# Patient Record
Sex: Male | Born: 1945 | Race: White | Hispanic: No | Marital: Married | Smoking: Never smoker
Health system: Southern US, Community
[De-identification: ages and names within clinical notes are randomized; demographics above are authoritative.]

## PROBLEM LIST (undated history)

## (undated) DIAGNOSIS — Z8546 Personal history of malignant neoplasm of prostate: Secondary | ICD-10-CM

## (undated) DIAGNOSIS — I10 Essential (primary) hypertension: Secondary | ICD-10-CM

## (undated) DIAGNOSIS — N1831 Chronic kidney disease, stage 3a: Secondary | ICD-10-CM

## (undated) DIAGNOSIS — E119 Type 2 diabetes mellitus without complications: Secondary | ICD-10-CM

## (undated) HISTORY — PX: PROSTATECTOMY: SHX69

## (undated) HISTORY — PX: LITHOTRIPSY: SUR834

---

## 2013-05-08 ENCOUNTER — Encounter (HOSPITAL_BASED_OUTPATIENT_CLINIC_OR_DEPARTMENT_OTHER): Payer: Self-pay | Admitting: Emergency Medicine

## 2013-05-08 ENCOUNTER — Emergency Department (HOSPITAL_BASED_OUTPATIENT_CLINIC_OR_DEPARTMENT_OTHER): Payer: Medicare Other

## 2013-05-08 ENCOUNTER — Emergency Department (HOSPITAL_BASED_OUTPATIENT_CLINIC_OR_DEPARTMENT_OTHER)
Admission: EM | Admit: 2013-05-08 | Discharge: 2013-05-08 | Disposition: A | Discharge status: Home / Self Care | Payer: Medicare Other | Attending: Emergency Medicine | Admitting: Emergency Medicine

## 2013-05-08 DIAGNOSIS — E119 Type 2 diabetes mellitus without complications: Secondary | ICD-10-CM | POA: Insufficient documentation

## 2013-05-08 DIAGNOSIS — Z79899 Other long term (current) drug therapy: Secondary | ICD-10-CM | POA: Insufficient documentation

## 2013-05-08 DIAGNOSIS — M51379 Other intervertebral disc degeneration, lumbosacral region without mention of lumbar back pain or lower extremity pain: Secondary | ICD-10-CM | POA: Insufficient documentation

## 2013-05-08 DIAGNOSIS — M5126 Other intervertebral disc displacement, lumbar region: Secondary | ICD-10-CM

## 2013-05-08 DIAGNOSIS — I10 Essential (primary) hypertension: Secondary | ICD-10-CM | POA: Insufficient documentation

## 2013-05-08 DIAGNOSIS — Z87442 Personal history of urinary calculi: Secondary | ICD-10-CM | POA: Insufficient documentation

## 2013-05-08 DIAGNOSIS — M5137 Other intervertebral disc degeneration, lumbosacral region: Secondary | ICD-10-CM | POA: Insufficient documentation

## 2013-05-08 HISTORY — DX: Essential (primary) hypertension: I10

## 2013-05-08 HISTORY — DX: Type 2 diabetes mellitus without complications: E11.9

## 2013-05-08 MED ORDER — HYDROMORPHONE HCL 2 MG PO TABS: 2.0000 mg | ORAL_TABLET | ORAL | Status: DC | PRN

## 2013-05-08 MED ORDER — MORPHINE SULFATE 4 MG/ML IJ SOLN
4.0000 mg | INTRAMUSCULAR | Status: DC | PRN
  Administered 2013-05-08: 4 mg via INTRAVENOUS
  Filled 2013-05-08: qty 1

## 2013-05-08 MED ORDER — CYCLOBENZAPRINE HCL 10 MG PO TABS
5.0000 mg | ORAL_TABLET | Freq: Once | ORAL | Status: AC
  Administered 2013-05-08: 5 mg via ORAL
  Filled 2013-05-08: qty 1

## 2013-05-08 MED ORDER — CYCLOBENZAPRINE HCL 5 MG PO TABS
7.5000 mg | ORAL_TABLET | Freq: Once | ORAL | Status: DC
  Filled 2013-05-08: qty 1.5

## 2013-05-08 NOTE — Discharge Instructions (Signed)
Back Pain, Adult Low back pain is very common. About 1 in 5 people have back pain.The cause of low back pain is rarely dangerous. The pain often gets better over time.About half of people with a sudden onset of back pain feel better in just 2 weeks. About 8 in 10 people feel better by 6 weeks.  CAUSES Some common causes of back pain include:  Strain of the muscles or ligaments supporting the spine.  Wear and tear (degeneration) of the spinal discs.  Arthritis.  Direct injury to the back. DIAGNOSIS Most of the time, the direct cause of low back pain is not known.However, back pain can be treated effectively even when the exact cause of the pain is unknown.Answering your caregiver's questions about your overall health and symptoms is one of the most accurate ways to make sure the cause of your pain is not dangerous. If your caregiver needs more information, he or she may order lab work or imaging tests (X-rays or MRIs).However, even if imaging tests show changes in your back, this usually does not require surgery. HOME CARE INSTRUCTIONS For many people, back pain returns.Since low back pain is rarely dangerous, it is often a condition that people can learn to manageon their own.   Remain active. It is stressful on the back to sit or stand in one place. Do not sit, drive, or stand in one place for more than 30 minutes at a time. Take short walks on level surfaces as soon as pain allows.Try to increase the length of time you walk each day.  Do not stay in bed.Resting more than 1 or 2 days can delay your recovery.  Do not avoid exercise or work.Your body is made to move.It is not dangerous to be active, even though your back may hurt.Your back will likely heal faster if you return to being active before your pain is gone.  Pay attention to your body when you bend and lift. Many people have less discomfortwhen lifting if they bend their knees, keep the load close to their bodies,and  avoid twisting. Often, the most comfortable positions are those that put less stress on your recovering back.  Find a comfortable position to sleep. Use a firm mattress and lie on your side with your knees slightly bent. If you lie on your back, put a pillow under your knees.  Only take over-the-counter or prescription medicines as directed by your caregiver. Over-the-counter medicines to reduce pain and inflammation are often the most helpful.Your caregiver may prescribe muscle relaxant drugs.These medicines help dull your pain so you can more quickly return to your normal activities and healthy exercise.  Put ice on the injured area.  Put ice in a plastic bag.  Place a towel between your skin and the bag.  Leave the ice on for 15-20 minutes, 03-04 times a day for the first 2 to 3 days. After that, ice and heat may be alternated to reduce pain and spasms.  Ask your caregiver about trying back exercises and gentle massage. This may be of some benefit.  Avoid feeling anxious or stressed.Stress increases muscle tension and can worsen back pain.It is important to recognize when you are anxious or stressed and learn ways to manage it.Exercise is a great option. SEEK MEDICAL CARE IF:  You have pain that is not relieved with rest or medicine.  You have pain that does not improve in 1 week.  You have new symptoms.  You are generally not feeling well. SEEK   IMMEDIATE MEDICAL CARE IF:   You have pain that radiates from your back into your legs.  You develop new bowel or bladder control problems.  You have unusual weakness or numbness in your arms or legs.  You develop nausea or vomiting.  You develop abdominal pain.  You feel faint. Document Released: 02/21/2005 Document Revised: 08/23/2011 Document Reviewed: 07/12/2010 [employer] Patient Information 2014 [employer].  

## 2013-05-08 NOTE — ED Provider Notes (Signed)
I saw and evaluated the patient, reviewed the resident's note and I agree with the findings and plan.   EKG Interpretation None      No results found for this or any previous visit. Ct Lumbar Spine Wo Contrast  05/08/2013   CLINICAL DATA:  Low back pain  EXAM: CT LUMBAR SPINE WITHOUT CONTRAST  TECHNIQUE: Multidetector CT imaging of the lumbar spine was performed without intravenous contrast administration. Multiplanar CT image reconstructions were also generated.  COMPARISON:  CT ABD-PEL WO/W CM dated 10/30/2012  FINDINGS: The vertebral body heights are maintained. The alignment is anatomic. The paravertebral soft tissues are normal. There is no fracture or static listhesis. There is no spondylolysis. There is degenerative disc disease most significant at L3-4, L4-5 and L5-S1.  There are mild degenerative changes of bilateral SI joints.  There is abdominal aortic atherosclerosis. There is a nonobstructing left nephrolithiasis. There is a 3.7 cm exophytic left upper pole renal mass measuring fluid attenuation most consistent with a cyst.  T12-L1: Mild broad-based disc bulge. No foraminal or central canal stenosis.  L1-L2: Minimal broad-based disc bulge. Mild bilateral facet arthropathy. No central canal or foraminal stenosis.  L2-L3: Mild broad-based disc bulge. Mild bilateral facet arthropathy. No foraminal or central canal stenosis.  L3-L4: Mild broad-based disc osteophyte complex with a more focal left far lateral disc osteophyte complex. Mild bilateral facet arthropathy. No right foraminal stenosis. Mild left foraminal stenosis.  L4-L5: Moderate size right foraminal disc protrusion resulting in severe foraminal stenosis. There is no left foraminal stenosis. There is mild bilateral facet arthropathy. Marland Kitchen.  L5-S1: Mild broad-based disc osteophyte complex. Moderate bilateral foraminal stenosis. Mild bilateral facet arthropathy.  IMPRESSION: 1. At L4-5 there is a moderate size right foraminal disc protrusion  resulting in severe right foraminal stenosis. 2. Nonobstructing left nephrolithiasis. 3. Left renal cyst. 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 Please refer to the CT abdomen/ pelvis dated 10/30/2012.   Electronically Signed   By: Elige KoHetal  Patel   On: 05/08/2013 12:43      Patient seen by me. Patient with a history of some back problems for the past 3 weeks ago acutely worse today. Patient followed by chiropractor in the past x-rays taken by them presumably without any sniffing abnormalities. Patient has been told that he does have sciatica. Has not had an MRI. Patient has pain shooting down the right leg. CT scan of the lumbar area rules out any acute bony injury or lytic lesions or tumors. But does show significant disease it could be responsible for his pain. Multiple levels of disc protrusion. Patient does have a primary care Dr. will have them contact him for consideration for MRI. Not available here today. Patient will be started on pain medicine and rest. Patient will return for new or worse symptoms. Patient is improved here with pain medicine he received in the emergency department.  Shelda JakesScott W. Shivan Hodes, MD 05/08/13 [phone]
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
## 2013-05-08 NOTE — ED Notes (Signed)
Xray called to report pt on table but has refused exam until pan med given-Sarah Bush,RN to xray to start IV and to give pain morphine

## 2013-05-08 NOTE — ED Notes (Signed)
Pt amb to room 10 with slow, steady gait. Pt reports back pain x 3 weeks, has been seeing a chiropractor, xrays taken and per pt he was told he has sciatica. Pt states that his pain increased this am, and it shoots down his right leg.

## 2013-06-06 ENCOUNTER — Other Ambulatory Visit: Payer: Self-pay | Admitting: Neurosurgery

## 2013-06-06 ENCOUNTER — Ambulatory Visit
Admission: RE | Admit: 2013-06-06 | Discharge: 2013-06-06 | Disposition: A | Discharge status: Home / Self Care | Payer: Medicare Other | Source: Ambulatory Visit | Attending: Neurosurgery | Admitting: Neurosurgery

## 2013-06-06 DIAGNOSIS — M5416 Radiculopathy, lumbar region: Secondary | ICD-10-CM

## 2013-06-06 MED ORDER — IOHEXOL 180 MG/ML  SOLN
1.0000 mL | Freq: Once | INTRAMUSCULAR | Status: AC | PRN
  Administered 2013-06-06: 1 mL via EPIDURAL

## 2013-06-06 MED ORDER — METHYLPREDNISOLONE ACETATE 40 MG/ML INJ SUSP (RADIOLOG
120.0000 mg | Freq: Once | INTRAMUSCULAR | Status: AC
  Administered 2013-06-06: 120 mg via EPIDURAL

## 2013-06-06 NOTE — Discharge Instructions (Signed)

## 2019-07-12 ENCOUNTER — Emergency Department (HOSPITAL_BASED_OUTPATIENT_CLINIC_OR_DEPARTMENT_OTHER): Payer: Medicare HMO

## 2019-07-12 ENCOUNTER — Emergency Department (HOSPITAL_BASED_OUTPATIENT_CLINIC_OR_DEPARTMENT_OTHER)
Admission: EM | Admit: 2019-07-12 | Discharge: 2019-07-12 | Disposition: A | Discharge status: Home / Self Care | Payer: Medicare HMO | Source: Home / Self Care | Attending: Emergency Medicine | Admitting: Emergency Medicine

## 2019-07-12 ENCOUNTER — Encounter (HOSPITAL_BASED_OUTPATIENT_CLINIC_OR_DEPARTMENT_OTHER): Payer: Self-pay | Admitting: *Deleted

## 2019-07-12 ENCOUNTER — Other Ambulatory Visit: Payer: Self-pay

## 2019-07-12 DIAGNOSIS — R509 Fever, unspecified: Secondary | ICD-10-CM | POA: Insufficient documentation

## 2019-07-12 DIAGNOSIS — Z79899 Other long term (current) drug therapy: Secondary | ICD-10-CM | POA: Insufficient documentation

## 2019-07-12 DIAGNOSIS — R112 Nausea with vomiting, unspecified: Secondary | ICD-10-CM | POA: Insufficient documentation

## 2019-07-12 DIAGNOSIS — U071 COVID-19: Secondary | ICD-10-CM | POA: Insufficient documentation

## 2019-07-12 LAB — RESPIRATORY PANEL BY RT PCR (FLU A&B, COVID)
Influenza A by PCR: NEGATIVE
Influenza B by PCR: NEGATIVE
SARS Coronavirus 2 by RT PCR: POSITIVE — AB

## 2019-07-12 LAB — COMPREHENSIVE METABOLIC PANEL
ALT: 17 U/L (ref 0–44)
AST: 25 U/L (ref 15–41)
Albumin: 4.2 g/dL (ref 3.5–5.0)
Alkaline Phosphatase: 77 U/L (ref 38–126)
Anion gap: 12 (ref 5–15)
BUN: 31 mg/dL — ABNORMAL HIGH (ref 8–23)
CO2: 22 mmol/L (ref 22–32)
Calcium: 9 mg/dL (ref 8.9–10.3)
Chloride: 100 mmol/L (ref 98–111)
Creatinine, Ser: 1.59 mg/dL — ABNORMAL HIGH (ref 0.61–1.24)
GFR calc Af Amer: 49 mL/min — ABNORMAL LOW (ref >60)
GFR calc non Af Amer: 42 mL/min — ABNORMAL LOW (ref >60)
Glucose, Bld: 172 mg/dL — ABNORMAL HIGH (ref 70–99)
Potassium: 4.1 mmol/L (ref 3.5–5.1)
Sodium: 134 mmol/L — ABNORMAL LOW (ref 135–145)
Total Bilirubin: 0.7 mg/dL (ref 0.3–1.2)
Total Protein: 8.2 g/dL — ABNORMAL HIGH (ref 6.5–8.1)

## 2019-07-12 LAB — CBC WITH DIFFERENTIAL/PLATELET
Abs Immature Granulocytes: 0.02 10*3/uL (ref 0.00–0.07)
Basophils Absolute: 0 10*3/uL (ref 0.0–0.1)
Basophils Relative: 0 %
Eosinophils Absolute: 0 10*3/uL (ref 0.0–0.5)
Eosinophils Relative: 0 %
HCT: 41.8 % (ref 39.0–52.0)
Hemoglobin: 13.9 g/dL (ref 13.0–17.0)
Immature Granulocytes: 0 %
Lymphocytes Relative: 15 %
Lymphs Abs: 1 10*3/uL (ref 0.7–4.0)
MCH: 29.6 pg (ref 26.0–34.0)
MCHC: 33.3 g/dL (ref 30.0–36.0)
MCV: 89.1 fL (ref 80.0–100.0)
Monocytes Absolute: 0.4 10*3/uL (ref 0.1–1.0)
Monocytes Relative: 7 %
Neutro Abs: 4.9 10*3/uL (ref 1.7–7.7)
Neutrophils Relative %: 78 %
Platelets: 232 10*3/uL (ref 150–400)
RBC: 4.69 MIL/uL (ref 4.22–5.81)
RDW: 13.3 % (ref 11.5–15.5)
WBC: 6.3 10*3/uL (ref 4.0–10.5)
nRBC: 0 % (ref 0.0–0.2)

## 2019-07-12 LAB — PROTIME-INR
INR: 0.9 (ref 0.8–1.2)
Prothrombin Time: 11.4 seconds (ref 11.4–15.2)

## 2019-07-12 LAB — APTT: aPTT: 30 seconds (ref 24–36)

## 2019-07-12 LAB — CBG MONITORING, ED: Glucose-Capillary: 154 mg/dL — ABNORMAL HIGH (ref 70–99)

## 2019-07-12 LAB — LACTIC ACID, PLASMA: Lactic Acid, Venous: 1.2 mmol/L (ref 0.5–1.9)

## 2019-07-12 LAB — SARS CORONAVIRUS 2 AG (30 MIN TAT): SARS Coronavirus 2 Ag: NEGATIVE

## 2019-07-12 MED ORDER — BENZONATATE 100 MG PO CAPS
100.0000 mg | ORAL_CAPSULE | Freq: Three times a day (TID) | ORAL | 0 refills | Status: AC
Start: 2019-07-12 — End: ?

## 2019-07-12 MED ORDER — SODIUM CHLORIDE 0.9 % IV BOLUS
1000.0000 mL | Freq: Once | INTRAVENOUS | Status: AC
  Administered 2019-07-12: 1000 mL via INTRAVENOUS

## 2019-07-12 MED ORDER — ONDANSETRON 4 MG PO TBDP: ORAL_TABLET | ORAL | 0 refills | Status: AC

## 2019-07-12 MED ORDER — LACTATED RINGERS IV BOLUS (SEPSIS)
1000.0000 mL | Freq: Once | INTRAVENOUS | Status: AC
  Administered 2019-07-12: 19:00:00 1000 mL via INTRAVENOUS

## 2019-07-12 MED ORDER — ACETAMINOPHEN 500 MG PO TABS
1000.0000 mg | ORAL_TABLET | Freq: Once | ORAL | Status: AC
  Administered 2019-07-12: 1000 mg via ORAL
  Filled 2019-07-12: qty 2

## 2019-07-12 NOTE — ED Provider Notes (Signed)
[facility] EMERGENCY DEPARTMENT Provider Note   CSN: [id number] Arrival date & time: 07/12/19  1817     History Chief Complaint  Patient presents with  . URI    Patrick Howard is a 74 y.o. male.  74 yo M with a chief complaints of cough fever. Going on for about a week now. Is also had some episodic nausea and vomiting. Thought to have the coronavirus had a negative initial test. Has been treated as bronchitis. Patient has been getting mildly worse. States that he has started to become confused. Has trouble remembering things. Does not think he is having continued nausea and vomiting. Denies abdominal pain denies chest pain feels that his muscles in his neck are a little bit sore but denies headache.  The history is provided by the patient.  URI Presenting symptoms: cough and fever   Presenting symptoms: no congestion   Associated symptoms: no arthralgias, no headaches and no myalgias   Illness Severity:  Moderate Onset quality:  Gradual Duration:  2 days Timing:  Constant Progression:  Worsening Chronicity:  New Associated symptoms: cough and fever   Associated symptoms: no abdominal pain, no chest pain, no congestion, no diarrhea, no headaches, no myalgias, no rash, no shortness of breath and no vomiting        Past Medical History:  Diagnosis Date  . Diabetes mellitus without complication (HCC)   . Hypertension     There are no problems to display for this patient.   History reviewed. No pertinent surgical history.     History reviewed. No pertinent family history.  Social History   Tobacco Use  . Smoking status: Never Smoker  Substance Use Topics  . Alcohol use: Not on file  . Drug use: Not on file    Home Medications Prior to Admission medications   Medication Sig Start Date End Date Taking? Authorizing Provider  benzonatate (TESSALON) 100 MG capsule Take 1 capsule (100 mg total) by mouth every 8 (eight) hours. 07/12/19   Melene Plan, DO    HYDROmorphone (DILAUDID) 2 MG tablet Take 1-2 tablets (2-4 mg total) by mouth every 4 (four) hours as needed for severe pain. 05/08/13   Kuneff, Renee A, DO  lisinopril (PRINIVIL,ZESTRIL) 10 MG tablet Take 10 mg by mouth daily.    [provider]  metFORMIN (GLUCOPHAGE) 500 MG tablet Take by mouth 2 (two) times daily with a meal.    [provider]  ondansetron (ZOFRAN ODT) 4 MG disintegrating tablet 4mg  ODT q4 hours prn nausea/vomit 07/12/19   09/11/19, DO  repaglinide (PRANDIN) 1 MG tablet Take 1 mg by mouth 2 (two) times daily before a meal.    [provider]    Allergies    Codeine and Novocain [procaine]  Review of Systems   Review of Systems  Constitutional: Positive for fever. Negative for chills.  HENT: Negative for congestion and facial swelling.   Eyes: Negative for discharge and visual disturbance.  Respiratory: Positive for cough. Negative for shortness of breath.   Cardiovascular: Negative for chest pain and palpitations.  Gastrointestinal: Negative for abdominal pain, diarrhea and vomiting.  Musculoskeletal: Negative for arthralgias and myalgias.  Skin: Negative for color change and rash.  Neurological: Negative for tremors, syncope and headaches.  Psychiatric/Behavioral: Negative for confusion and dysphoric mood.    Physical Exam Updated Vital Signs BP 132/76   Pulse 99   Temp (!) 100.4 F (38 C) (Oral)   Resp (!) 22  Ht 5\' 10"  (1.778 m)   Wt 74.8 kg   SpO2 93%   BMI 23.68 kg/m   Physical Exam Vitals and nursing note reviewed.  Constitutional:      Appearance: He is well-developed.  HENT:     Head: Normocephalic and atraumatic.  Eyes:     Pupils: Pupils are equal, round, and reactive to light.  Neck:     Vascular: No JVD.  Cardiovascular:     Rate and Rhythm: Normal rate and regular rhythm.     Heart sounds: No murmur. No friction rub. No gallop.   Pulmonary:     Effort: No respiratory distress.     Breath sounds: No  wheezing.  Abdominal:     General: There is no distension.     Tenderness: There is no abdominal tenderness. There is no guarding or rebound.  Musculoskeletal:        General: Normal range of motion.     Cervical back: Normal range of motion and neck supple.  Skin:    Coloration: Skin is not pale.     Findings: No rash.  Neurological:     Mental Status: He is alert and oriented to person, place, and time.     Comments: Has trouble remembering some events  Psychiatric:        Behavior: Behavior normal.     ED Results / Procedures / Treatments   Labs (all labs ordered are listed, but only abnormal results are displayed) Labs Reviewed  RESPIRATORY PANEL BY RT PCR (FLU A&B, COVID) - Abnormal; Notable for the following components:      Result Value   SARS Coronavirus 2 by RT PCR POSITIVE (*)    All other components within normal limits  COMPREHENSIVE METABOLIC PANEL - Abnormal; Notable for the following components:   Sodium 134 (*)    Glucose, Bld 172 (*)    BUN 31 (*)    Creatinine, Ser 1.59 (*)    Total Protein 8.2 (*)    GFR calc non Af Amer 42 (*)    GFR calc Af Amer 49 (*)    All other components within normal limits  CBG MONITORING, ED - Abnormal; Notable for the following components:   Glucose-Capillary 154 (*)    All other components within normal limits  SARS CORONAVIRUS 2 AG (30 MIN TAT)  CULTURE, BLOOD (ROUTINE X 2)  URINE CULTURE  CULTURE, BLOOD (ROUTINE X 2) W REFLEX TO ID PANEL  LACTIC ACID, PLASMA  CBC WITH DIFFERENTIAL/PLATELET  APTT  PROTIME-INR  LACTIC ACID, PLASMA  URINALYSIS, ROUTINE W REFLEX MICROSCOPIC    EKG EKG Interpretation  Date/Time:  Friday Jul 12 2019 18:53:49 EDT Ventricular Rate:  101 PR Interval:    QRS Duration: 82 QT Interval:  316 QTC Calculation: 410 R Axis:   63 Text Interpretation: Sinus tachycardia No old tracing to compare Confirmed by [id number] [phone]) on 07/12/2019 8:13:38 PM   Radiology CT Head Wo Contrast  Result  Date: 07/12/2019 CLINICAL DATA:  Pt very vague in triage. Reports cough x several weeks that is intermittent. Reports intermittent abdominal pain with intermittent N/V. Reports fever intermittently over the last 2 weeks tmax 100.7. EXAM: CT HEAD WITHOUT CONTRAST TECHNIQUE: Contiguous axial images were obtained from the base of the skull through the vertex without intravenous contrast. COMPARISON:  None. FINDINGS: Brain: No evidence of acute infarction, hemorrhage, hydrocephalus, extra-axial collection or mass lesion/mass effect. There is ventricular and sulcal enlargement reflecting mild atrophy. Small old lacunar  infarct projects near the genu of the left internal capsule. Mild patchy areas of white matter hypoattenuation also noted consistent with chronic microvascular ischemic change. Vascular: No hyperdense vessel or unexpected calcification. Skull: Normal. Negative for fracture or focal lesion. Sinuses/Orbits: Normal globes and orbits. Mild right maxillary sinus mucosal thickening with dependent fluid. Remaining sinuses are clear. Other: None. IMPRESSION: 1. No acute intracranial abnormalities. 2. 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 3. Right maxillary sinus mucosal thickening with dependent fluid. Consider acute sinusitis in the proper clinical setting. Electronically Signed   By: Amie Portland M.D.   On: 07/12/2019 19:46   DG Chest Port 1 View  Result Date: 07/12/2019 CLINICAL DATA:  Intermittent fever and cough, and nausea and vomiting for several days. EXAM: PORTABLE CHEST 1 VIEW COMPARISON:  None. FINDINGS: Cardiac silhouette is normal in size. No mediastinal or hilar masses or evidence of adenopathy. Lungs are clear.  No pleural effusion or pneumothorax. Skeletal structures are grossly intact. IMPRESSION: No active disease. Electronically Signed   By: Amie Portland M.D.   On: 07/12/2019 19:56    Procedures Procedures (including  critical care time)  Medications Ordered in ED Medications  lactated ringers bolus 1,000 mL (0 mLs Intravenous Stopped 07/12/19 2104)  acetaminophen (TYLENOL) tablet 1,000 mg (1,000 mg Oral Given 07/12/19 2032)  sodium chloride 0.9 % bolus 1,000 mL (1,000 mLs Intravenous New Bag/Given 07/12/19 2104)    ED Course  I have reviewed the triage vital signs and the nursing notes.  Pertinent labs & imaging results that were available during my care of the patient were reviewed by me and considered in my medical decision making (see chart for details).    MDM Rules/Calculators/A&P                      74 yo M with a chief complaints of cough and fever. Going on for about a week. Having some confusion now. Will obtain a laboratory evaluation chest x-ray UA bolus of IV fluids Tylenol reassess.  Chest x-ray without significant pathology as read by me.  Lab work without significant electrolyte abnormality.  CT of the head negative.  Patient's coronavirus point-of-care test was negative.  Sent off for the 2-hour.  2-hour test returned positive.  Discussed the results with the patient.  Not requiring oxygen.  Patient feels much better after 2 L of IV fluids and Tylenol.  Discussed inpatient versus outpatient therapy and he would like to try and go home.  Will consult case management for possible placement into the Covid home program.  9:26 PM:  I have discussed the diagnosis/risks/treatment options with the patient and believe the pt to be eligible for discharge home to follow-up with PCP. We also discussed returning to the ED immediately if new or worsening sx occur. We discussed the sx which are most concerning (e.g., sudden worsening pain, fever, inability to tolerate by mouth) that necessitate immediate return. Medications administered to the patient during their visit and any new prescriptions provided to the patient are listed below.  Medications given during this visit Medications  lactated ringers  bolus 1,000 mL (0 mLs Intravenous Stopped 07/12/19 2104)  acetaminophen (TYLENOL) tablet 1,000 mg (1,000 mg Oral Given 07/12/19 2032)  sodium chloride 0.9 % bolus 1,000 mL (1,000 mLs Intravenous New Bag/Given 07/12/19 2104)     The patient appears reasonably screen and/or stabilized for discharge and I doubt any other medical condition or  other [place] requiring further screening, evaluation, or treatment in the ED at this time prior to discharge.   Final Clinical Impression(s) / ED Diagnoses Final diagnoses:  [id number] virus infection    Rx / DC Orders ED Discharge Orders         Ordered    ondansetron (ZOFRAN ODT) 4 MG disintegrating tablet     07/12/19 2122    benzonatate (TESSALON) 100 MG capsule  Every 8 hours     07/12/19 2122           Deno Etienne, DO 07/12/19 2126

## 2019-07-12 NOTE — ED Notes (Signed)
  Spoke with patients wife and told her that he was COVID +.  Patient prefers d/c home to being admitted and will be discharged shortly.

## 2019-07-12 NOTE — ED Triage Notes (Addendum)
Pt very vague in triage. Reports cough x several weeks that is intermittent. Reports intermittent abdominal pain with intermittent N/V. Reports fever intermittently over the last 2 weeks tmax 100.7.   Pt seen by his PCP, dx with bronchitis 2 weeks ago. Pt unsure if he is taking his meds or not.

## 2019-07-12 NOTE — ED Notes (Signed)
Date and time results received: 07/12/19 2121 (use smartphrase ".now" to insert current time)  Test: COVID Critical Value: positive  Name of Provider Notified: Dr. Adela Lank  Orders Received? Or Actions Taken?: no new orders

## 2019-07-12 NOTE — ED Notes (Signed)
ED Provider at bedside. 

## 2019-07-12 NOTE — Discharge Instructions (Signed)
Take tylenol 2 pills 4 times a day and motrin 4 pills 3 times a day.  Drink plenty of fluids.  Return for worsening shortness of breath, headache, confusion. Follow up with your family doctor.   

## 2019-07-13 ENCOUNTER — Telehealth: Payer: Self-pay | Admitting: Adult Health

## 2019-07-13 ENCOUNTER — Telehealth: Payer: Self-pay

## 2019-07-13 NOTE — Telephone Encounter (Signed)
Called to discuss with Tollie Pizza about Covid symptoms and the use of bamlanivimab, a monoclonal antibody infusion for those with mild to moderate Covid symptoms and at a high risk of hospitalization.     Pt is qualified for this infusion at the [facility] infusion center due to co-morbid conditions and/or a member of an at-risk group, however declines infusion at this time. Symptoms tier reviewed as well as criteria for ending isolation.  Symptoms reviewed that would warrant ED/Hospital evaluation. Preventative practices reviewed. Patient verbalized understanding. Patient advised to call back if he decides that he does want to get infusion. Callback number to the infusion center given. Patient advised to go to Urgent care or ED with severe symptoms. Last date he would be eligible for infusion is 5/10 .    There are no problems to display for this patient.   Takeila Thayne NP-C  [facility]    07/13/2019

## 2019-07-13 NOTE — Telephone Encounter (Signed)
Called [facility] to follow up on the patient and offer remote services to monitor. Explained services of calling in checking, and could come to home, monitoring. Wife declined services. at this time. Does have family to bring supplies.

## 2019-07-15 ENCOUNTER — Inpatient Hospital Stay (HOSPITAL_BASED_OUTPATIENT_CLINIC_OR_DEPARTMENT_OTHER)
Admission: EM | Admit: 2019-07-15 | Discharge: 2019-07-18 | DRG: 177 | Disposition: A | Discharge status: Home / Self Care | Payer: Medicare HMO | Attending: Internal Medicine | Admitting: Internal Medicine

## 2019-07-15 ENCOUNTER — Emergency Department (HOSPITAL_BASED_OUTPATIENT_CLINIC_OR_DEPARTMENT_OTHER): Payer: Medicare HMO

## 2019-07-15 ENCOUNTER — Encounter (HOSPITAL_BASED_OUTPATIENT_CLINIC_OR_DEPARTMENT_OTHER): Payer: Self-pay | Admitting: *Deleted

## 2019-07-15 ENCOUNTER — Other Ambulatory Visit: Payer: Self-pay

## 2019-07-15 DIAGNOSIS — I10 Essential (primary) hypertension: Secondary | ICD-10-CM | POA: Diagnosis present

## 2019-07-15 DIAGNOSIS — Z833 Family history of diabetes mellitus: Secondary | ICD-10-CM

## 2019-07-15 DIAGNOSIS — Z884 Allergy status to anesthetic agent status: Secondary | ICD-10-CM

## 2019-07-15 DIAGNOSIS — R112 Nausea with vomiting, unspecified: Secondary | ICD-10-CM | POA: Diagnosis present

## 2019-07-15 DIAGNOSIS — Z8546 Personal history of malignant neoplasm of prostate: Secondary | ICD-10-CM

## 2019-07-15 DIAGNOSIS — J9600 Acute respiratory failure, unspecified whether with hypoxia or hypercapnia: Secondary | ICD-10-CM | POA: Diagnosis present

## 2019-07-15 DIAGNOSIS — Z79899 Other long term (current) drug therapy: Secondary | ICD-10-CM | POA: Diagnosis not present

## 2019-07-15 DIAGNOSIS — E119 Type 2 diabetes mellitus without complications: Secondary | ICD-10-CM

## 2019-07-15 DIAGNOSIS — J1282 Pneumonia due to coronavirus disease 2019: Secondary | ICD-10-CM | POA: Diagnosis present

## 2019-07-15 DIAGNOSIS — I129 Hypertensive chronic kidney disease with stage 1 through stage 4 chronic kidney disease, or unspecified chronic kidney disease: Secondary | ICD-10-CM | POA: Diagnosis present

## 2019-07-15 DIAGNOSIS — Z794 Long term (current) use of insulin: Secondary | ICD-10-CM | POA: Diagnosis not present

## 2019-07-15 DIAGNOSIS — Z885 Allergy status to narcotic agent status: Secondary | ICD-10-CM | POA: Diagnosis not present

## 2019-07-15 DIAGNOSIS — E1165 Type 2 diabetes mellitus with hyperglycemia: Secondary | ICD-10-CM | POA: Diagnosis present

## 2019-07-15 DIAGNOSIS — J9601 Acute respiratory failure with hypoxia: Secondary | ICD-10-CM | POA: Diagnosis present

## 2019-07-15 DIAGNOSIS — U071 COVID-19: Principal | ICD-10-CM | POA: Diagnosis present

## 2019-07-15 DIAGNOSIS — E1122 Type 2 diabetes mellitus with diabetic chronic kidney disease: Secondary | ICD-10-CM | POA: Diagnosis present

## 2019-07-15 DIAGNOSIS — N1831 Chronic kidney disease, stage 3a: Secondary | ICD-10-CM | POA: Diagnosis present

## 2019-07-15 HISTORY — DX: Chronic kidney disease, stage 3a: N18.31

## 2019-07-15 HISTORY — DX: Personal history of malignant neoplasm of prostate: Z85.46

## 2019-07-15 LAB — COMPREHENSIVE METABOLIC PANEL
ALT: 17 U/L (ref 0–44)
AST: 26 U/L (ref 15–41)
Albumin: 3.5 g/dL (ref 3.5–5.0)
Alkaline Phosphatase: 70 U/L (ref 38–126)
Anion gap: 12 (ref 5–15)
BUN: 22 mg/dL (ref 8–23)
CO2: 22 mmol/L (ref 22–32)
Calcium: 9.4 mg/dL (ref 8.9–10.3)
Chloride: 100 mmol/L (ref 98–111)
Creatinine, Ser: 1.28 mg/dL — ABNORMAL HIGH (ref 0.61–1.24)
GFR calc Af Amer: >60 mL/min (ref >60)
GFR calc non Af Amer: 55 mL/min — ABNORMAL LOW (ref >60)
Glucose, Bld: 212 mg/dL — ABNORMAL HIGH (ref 70–99)
Potassium: 4.2 mmol/L (ref 3.5–5.1)
Sodium: 134 mmol/L — ABNORMAL LOW (ref 135–145)
Total Bilirubin: 0.9 mg/dL (ref 0.3–1.2)
Total Protein: 7.5 g/dL (ref 6.5–8.1)

## 2019-07-15 LAB — C-REACTIVE PROTEIN: CRP: 18.9 mg/dL — ABNORMAL HIGH (ref <1.0)

## 2019-07-15 LAB — CBC WITH DIFFERENTIAL/PLATELET
Abs Immature Granulocytes: 0.04 10*3/uL (ref 0.00–0.07)
Basophils Absolute: 0 10*3/uL (ref 0.0–0.1)
Basophils Relative: 0 %
Eosinophils Absolute: 0 10*3/uL (ref 0.0–0.5)
Eosinophils Relative: 0 %
HCT: 39.8 % (ref 39.0–52.0)
Hemoglobin: 13.4 g/dL (ref 13.0–17.0)
Immature Granulocytes: 1 %
Lymphocytes Relative: 12 %
Lymphs Abs: 1 10*3/uL (ref 0.7–4.0)
MCH: 29.6 pg (ref 26.0–34.0)
MCHC: 33.7 g/dL (ref 30.0–36.0)
MCV: 87.9 fL (ref 80.0–100.0)
Monocytes Absolute: 0.5 10*3/uL (ref 0.1–1.0)
Monocytes Relative: 6 %
Neutro Abs: 6.4 10*3/uL (ref 1.7–7.7)
Neutrophils Relative %: 81 %
Platelets: 274 10*3/uL (ref 150–400)
RBC: 4.53 MIL/uL (ref 4.22–5.81)
RDW: 13.3 % (ref 11.5–15.5)
WBC: 7.9 10*3/uL (ref 4.0–10.5)
nRBC: 0 % (ref 0.0–0.2)

## 2019-07-15 LAB — LIPASE, BLOOD: Lipase: 26 U/L (ref 11–51)

## 2019-07-15 LAB — D-DIMER, QUANTITATIVE: D-Dimer, Quant: 2.55 ug/mL-FEU — ABNORMAL HIGH (ref 0.00–0.50)

## 2019-07-15 LAB — TRIGLYCERIDES: Triglycerides: 197 mg/dL — ABNORMAL HIGH (ref <150)

## 2019-07-15 LAB — LACTIC ACID, PLASMA: Lactic Acid, Venous: 1.2 mmol/L (ref 0.5–1.9)

## 2019-07-15 LAB — FIBRINOGEN: Fibrinogen: >800 mg/dL — ABNORMAL HIGH (ref 210–475)

## 2019-07-15 LAB — LACTATE DEHYDROGENASE: LDH: 221 U/L — ABNORMAL HIGH (ref 98–192)

## 2019-07-15 LAB — FERRITIN: Ferritin: 667 ng/mL — ABNORMAL HIGH (ref 24–336)

## 2019-07-15 LAB — PROCALCITONIN: Procalcitonin: <0.1 ng/mL

## 2019-07-15 MED ORDER — ACETAMINOPHEN 325 MG PO TABS
650.0000 mg | ORAL_TABLET | Freq: Once | ORAL | Status: AC
  Administered 2019-07-15: 17:00:00 650 mg via ORAL
  Filled 2019-07-15: qty 2

## 2019-07-15 MED ORDER — ACETAMINOPHEN 325 MG PO TABS
650.0000 mg | ORAL_TABLET | Freq: Once | ORAL | Status: AC
  Administered 2019-07-15: 650 mg via ORAL
  Filled 2019-07-15: qty 2

## 2019-07-15 MED ORDER — IOHEXOL 350 MG/ML SOLN
100.0000 mL | Freq: Once | INTRAVENOUS | Status: AC | PRN
  Administered 2019-07-15: 14:00:00 100 mL via INTRAVENOUS

## 2019-07-15 NOTE — Progress Notes (Signed)
Patient is a 18 male with history of hypertension, diabetes type 2 who presents to the emergency department at [facility] who presented with complaints of dyspnea, cough, fever.  Symptoms started about a week ago.  His wife is also sick.  He presented to med [facility] on 07/12/2019 and was diagnosed with Covid but was discharged because he did not require any oxygen.  Patient went home, continued to feel bad that he represented today.  Chest x-ray on 07/16/2019 does not show any pneumonia but chest x-ray done today showed hazy opacities at the bilateral lung bases. On presentation he was tachypneic, tachycardic but maintaining his saturation on room air.  Creatinine of 1.28 .  Elevated inflammatory markers with CRP of 18.9.   Patient accepted for admission for Covid pneumonia.

## 2019-07-15 NOTE — ED Triage Notes (Signed)
Dx with Covid this past Friday, having abd pain and some difficulty breathing, states it hurts to breathe

## 2019-07-15 NOTE — ED Notes (Signed)
O2 sat 90-93% whiles ambulating; pt tired easily; HR up to 115, resp 28

## 2019-07-15 NOTE — ED Notes (Signed)
RN called pt's daughter and transferred call to pt's room so that daughter could try to convince pt to stay; pt is agreeable to stay for now, but is refusing to be put back on cardiac monitor.

## 2019-07-15 NOTE — ED Notes (Signed)
Pt was provided a mattress for the bed for comfort.

## 2019-07-15 NOTE — Progress Notes (Signed)
Patient SPO2 is 88% on room air.  Placed patient on 3 liter nasal cannula.  Patient's SPO2 increased to 95%.  RT will continue to monitor.

## 2019-07-15 NOTE — ED Notes (Signed)
Pt sts he wants to leave; he is tired of being here. Explained to pt that we still need him to have CT, but will let provider know that he wants to go.

## 2019-07-15 NOTE — ED Provider Notes (Signed)
[facility] EMERGENCY DEPARTMENT Provider Note   CSN: [id number] Arrival date & time: 07/15/19  0277     History Chief Complaint  Patient presents with  . Abdominal Pain    Patrick Howard is a 74 y.o. male.  Patient is a 74 year old gentleman who has a past medical history of diabetes and hypertension presenting to the emergency department for COVID-19.  Patient was diagnosed with COVID-19 on the seventh of this month.  Reports he feels like his breathing is getting worse.  He has persistent shortness of breath.  Denies any nausea, vomiting, fever, chills.  Has decreased appetite.  Reports weight loss.  Symptoms have been ongoing now for about 7 days.        Past Medical History:  Diagnosis Date  . Diabetes mellitus without complication (HCC)   . Hypertension     Patient Active Problem List   Diagnosis Date Noted  . COVID-19 07/15/2019    History reviewed. No pertinent surgical history.     History reviewed. No pertinent family history.  Social History   Tobacco Use  . Smoking status: Never Smoker  Substance Use Topics  . Alcohol use: Not on file  . Drug use: Not on file    Home Medications Prior to Admission medications   Medication Sig Start Date End Date Taking? Authorizing Provider  benzonatate (TESSALON) 100 MG capsule Take 1 capsule (100 mg total) by mouth every 8 (eight) hours. 07/12/19   Melene Plan, DO  HYDROmorphone (DILAUDID) 2 MG tablet Take 1-2 tablets (2-4 mg total) by mouth every 4 (four) hours as needed for severe pain. 05/08/13   Kuneff, Renee A, DO  lisinopril (PRINIVIL,ZESTRIL) 10 MG tablet Take 10 mg by mouth daily.    [provider]  metFORMIN (GLUCOPHAGE) 500 MG tablet Take by mouth 2 (two) times daily with a meal.    [provider]  ondansetron (ZOFRAN ODT) 4 MG disintegrating tablet 4mg  ODT q4 hours prn nausea/vomit 07/12/19   09/11/19, DO  repaglinide (PRANDIN) 1 MG tablet Take 1 mg by mouth 2 (two) times  daily before a meal.    [provider]    Allergies    Codeine and Novocain [procaine]  Review of Systems   Review of Systems  Constitutional: Positive for appetite change and fatigue. Negative for fever.  HENT: Negative for sore throat.   Eyes: Negative for visual disturbance.  Respiratory: Positive for cough and shortness of breath.   Cardiovascular: Negative for chest pain.  Gastrointestinal: Negative.   Genitourinary: Negative for dysuria.  Musculoskeletal: Negative.   Skin: Negative for rash.  Neurological: Positive for weakness (generalized). Negative for dizziness.    Physical Exam Updated Vital Signs BP 111/68 (BP Location: Left Arm)   Pulse 71   Temp 97.7 F (36.5 C) (Oral)   Resp (!) 23   Ht 5\' 10"  (1.778 m)   Wt 72.6 kg   SpO2 95%   BMI 22.96 kg/m   Physical Exam Vitals and nursing note reviewed.  Constitutional:      General: He is not in acute distress.    Appearance: Normal appearance. He is well-developed. He is not ill-appearing, toxic-appearing or diaphoretic.  HENT:     Head: Normocephalic.     Mouth/Throat:     Mouth: Mucous membranes are moist.  Eyes:     Conjunctiva/sclera: Conjunctivae normal.  Cardiovascular:     Rate and Rhythm: Normal rate and regular rhythm.  Pulmonary:  Effort: Pulmonary effort is normal.     Breath sounds: Normal breath sounds.     Comments: Shallow breaths secondary to painful breathing Abdominal:     General: Abdomen is flat. Bowel sounds are normal.     Palpations: Abdomen is soft.     Tenderness: There is no abdominal tenderness.  Skin:    General: Skin is dry.  Neurological:     Mental Status: He is alert.  Psychiatric:        Mood and Affect: Mood normal.     ED Results / Procedures / Treatments   Labs (all labs ordered are listed, but only abnormal results are displayed) Labs Reviewed  COMPREHENSIVE METABOLIC PANEL - Abnormal; Notable for the following components:      Result Value    Sodium 134 (*)    Glucose, Bld 212 (*)    Creatinine, Ser 1.28 (*)    GFR calc non Af Amer 55 (*)    All other components within normal limits  D-DIMER, QUANTITATIVE (NOT AT [facility]) - Abnormal; Notable for the following components:   D-Dimer, Quant 2.55 (*)    All other components within normal limits  LACTATE DEHYDROGENASE - Abnormal; Notable for the following components:   LDH 221 (*)    All other components within normal limits  FERRITIN - Abnormal; Notable for the following components:   Ferritin 667 (*)    All other components within normal limits  TRIGLYCERIDES - Abnormal; Notable for the following components:   Triglycerides 197 (*)    All other components within normal limits  FIBRINOGEN - Abnormal; Notable for the following components:   Fibrinogen >800 (*)    All other components within normal limits  C-REACTIVE PROTEIN - Abnormal; Notable for the following components:   CRP 18.9 (*)    All other components within normal limits  HEMOGLOBIN A1C - Abnormal; Notable for the following components:   Hgb A1c MFr Bld 11.5 (*)    All other components within normal limits  CBG MONITORING, ED - Abnormal; Notable for the following components:   Glucose-Capillary 183 (*)    All other components within normal limits  CULTURE, BLOOD (ROUTINE X 2)  CULTURE, BLOOD (ROUTINE X 2)  CBC WITH DIFFERENTIAL/PLATELET  LIPASE, BLOOD  LACTIC ACID, PLASMA  PROCALCITONIN    EKG    Radiology CT Angio Chest PE W and/or Wo Contrast  Result Date: 07/15/2019 CLINICAL DATA:  Chest pain, abdominal pain, some difficulty breathing, hurts debris, hypertension, diabetes mellitus, diagnosed with COVID-19 three days ago EXAM: CT ANGIOGRAPHY CHEST WITH CONTRAST TECHNIQUE: Multidetector CT imaging of the chest was performed using the standard protocol during bolus administration of intravenous contrast. Multiplanar CT image reconstructions and MIPs were obtained to evaluate the vascular anatomy. CONTRAST:   168mL OMNIPAQUE IOHEXOL 350 MG/ML SOLN IV COMPARISON:  None FINDINGS: Cardiovascular: Atherosclerotic calcifications aorta and minimally in coronary arteries. Aorta normal caliber without aneurysm or dissection. Heart unremarkable. No pericardial effusion. Pulmonary arteries adequately opacified and patent. No evidence of pulmonary embolism. Mediastinum/Nodes: 8 mm RIGHT thyroid nodule image 7; Not clinically significant; no follow-up imaging recommended (ref: J Am Coll Radiol. 2015 Feb;12(2): 143-50).Esophagus unremarkable. Scattered normal size mediastinal lymph nodes. Enlarged subcarinal node 15 mm image 52. No additional thoracic adenopathy. Lungs/Pleura: Patchy airspace infiltrates greatest in lower lobes, less in lingula and RIGHT middle lobe, consistent with COVID pneumonia. Subpleural nodule RIGHT upper lobe 4 mm diameter image 37. 3 mm RIGHT upper lobe nodule image 29. No pleural  effusion or pneumothorax. Upper Abdomen: Multiple BILATERAL renal cysts. Remaining visualized upper abdomen unremarkable. Musculoskeletal: No acute osseous findings. Review of the MIP images confirms the above findings. IMPRESSION: No evidence of pulmonary embolism. Patchy BILATERAL airspace infiltrates consistent with multifocal pneumonia / COVID-19. Nonspecific enlarged subcarinal lymph node. Aortic Atherosclerosis (ICD10-I70.0). Electronically Signed   By: Ulyses Southward M.D.   On: 07/15/2019 14:51   DG Chest Portable 1 View  Result Date: 07/15/2019 CLINICAL DATA:  COVID. Additional history provided: Diagnosed with COVID last Friday, abdominal pain with some difficulty breathing, pain with breathing, history of diabetes and hypertension. EXAM: PORTABLE CHEST 1 VIEW COMPARISON:  Chest radiograph 07/12/2019. FINDINGS: There are ill-defined opacities within the bilateral lung bases which are new as compared to prior examination 07/12/2019 and consistent with the provided history of COVID pneumonia. No evidence of pleural effusion  or pneumothorax. Heart size within normal limits. Aortic atherosclerosis. No acute bony abnormality identified IMPRESSION: Hazy opacities at the bilateral lung bases consistent with the provided history of COVID pneumonia. Aortic Atherosclerosis (ICD10-I70.0). Electronically Signed   By: Jackey Loge DO   On: 07/15/2019 10:08    Procedures Procedures (including critical care time)  Medications Ordered in ED Medications  lisinopril (ZESTRIL) tablet 10 mg (has no administration in time range)  ondansetron (ZOFRAN-ODT) disintegrating tablet 4 mg (has no administration in time range)  insulin aspart (novoLOG) injection 0-9 Units (2 Units Subcutaneous Given 07/16/19 0609)  remdesivir 100 mg in sodium chloride 0.9 % 100 mL IVPB (0 mg Intravenous Stopped 07/16/19 0810)    Followed by  remdesivir 100 mg in sodium chloride 0.9 % 100 mL IVPB (has no administration in time range)  acetaminophen (TYLENOL) tablet 650 mg (650 mg Oral Given 07/15/19 1136)  iohexol (OMNIPAQUE) 350 MG/ML injection 100 mL (100 mLs Intravenous Contrast Given 07/15/19 1429)  acetaminophen (TYLENOL) tablet 650 mg (650 mg Oral Given 07/15/19 1714)  dexamethasone (DECADRON) injection 10 mg (10 mg Intravenous Given 07/16/19 5465)    ED Course  I have reviewed the triage vital signs and the nursing notes.  Pertinent labs & imaging results that were available during my care of the patient were reviewed by me and considered in my medical decision making (see chart for details).  Clinical Course as of Jul 16 843  Mon Jul 15, 2019  1416 Patient diagnosis COVID-19 3 days ago.  Symptoms for the last 1 week.  On exam he appears to have pain with breathing and complains of shortness of breath.  Oxygen dropped to 90% with ambulation and he became tachycardic and tachypneic.  Significantly elevated D-dimer.  X-ray is consistent with Covid pneumonia.  CTA performed given his pain with breathing and other findings.   [KM]  1506 CTA normal.  Discussed admission vs d/c with patient. Patient would like to be admitted. He has been significant tachycardic and tachypnic with ambulation with oxygen at 92% just at rest.  Will consult hospitalist for admission   [KM]    Clinical Course User Index [KM] Jeral Pinch   MDM Rules/Calculators/A&P                     .  Final Clinical Impression(s) / ED Diagnoses Final diagnoses:  COVID-19    Rx / DC Orders ED Discharge Orders    None       Little, Ambrose Finland, MD 07/20/19 [phone]

## 2019-07-16 ENCOUNTER — Encounter (HOSPITAL_COMMUNITY): Payer: Self-pay | Admitting: Internal Medicine

## 2019-07-16 DIAGNOSIS — J1282 Pneumonia due to coronavirus disease 2019: Secondary | ICD-10-CM

## 2019-07-16 DIAGNOSIS — N1831 Chronic kidney disease, stage 3a: Secondary | ICD-10-CM

## 2019-07-16 DIAGNOSIS — U071 COVID-19: Principal | ICD-10-CM

## 2019-07-16 DIAGNOSIS — I10 Essential (primary) hypertension: Secondary | ICD-10-CM

## 2019-07-16 DIAGNOSIS — J9601 Acute respiratory failure with hypoxia: Secondary | ICD-10-CM

## 2019-07-16 DIAGNOSIS — E119 Type 2 diabetes mellitus without complications: Secondary | ICD-10-CM

## 2019-07-16 DIAGNOSIS — J9600 Acute respiratory failure, unspecified whether with hypoxia or hypercapnia: Secondary | ICD-10-CM | POA: Diagnosis present

## 2019-07-16 LAB — HEMOGLOBIN A1C
Hgb A1c MFr Bld: 11.5 % — ABNORMAL HIGH (ref 4.8–5.6)
Mean Plasma Glucose: 283.35 mg/dL

## 2019-07-16 LAB — CBG MONITORING, ED
Glucose-Capillary: 183 mg/dL — ABNORMAL HIGH (ref 70–99)
Glucose-Capillary: 222 mg/dL — ABNORMAL HIGH (ref 70–99)
Glucose-Capillary: 287 mg/dL — ABNORMAL HIGH (ref 70–99)

## 2019-07-16 LAB — GLUCOSE, CAPILLARY
Glucose-Capillary: 325 mg/dL — ABNORMAL HIGH (ref 70–99)
Glucose-Capillary: 408 mg/dL — ABNORMAL HIGH (ref 70–99)

## 2019-07-16 MED ORDER — ENOXAPARIN SODIUM 40 MG/0.4ML ~~LOC~~ SOLN: 40.0000 mg | SUBCUTANEOUS | Status: DC

## 2019-07-16 MED ORDER — INSULIN ASPART 100 UNIT/ML ~~LOC~~ SOLN
0.0000 [IU] | Freq: Every day | SUBCUTANEOUS | Status: DC
  Administered 2019-07-16: 5 [IU] via SUBCUTANEOUS
  Administered 2019-07-17: 22:00:00 4 [IU] via SUBCUTANEOUS

## 2019-07-16 MED ORDER — OXYBUTYNIN CHLORIDE ER 5 MG PO TB24
5.0000 mg | ORAL_TABLET | Freq: Every day | ORAL | Status: DC
  Administered 2019-07-16 – 2019-07-18 (×3): 5 mg via ORAL
  Filled 2019-07-16 (×3): qty 1

## 2019-07-16 MED ORDER — ONDANSETRON HCL 4 MG PO TABS: 4.0000 mg | ORAL_TABLET | Freq: Four times a day (QID) | ORAL | Status: DC | PRN

## 2019-07-16 MED ORDER — ACETAMINOPHEN 325 MG PO TABS: 650.0000 mg | ORAL_TABLET | Freq: Four times a day (QID) | ORAL | Status: DC | PRN

## 2019-07-16 MED ORDER — LOSARTAN POTASSIUM 25 MG PO TABS
25.0000 mg | ORAL_TABLET | Freq: Every day | ORAL | Status: DC
  Administered 2019-07-17 – 2019-07-18 (×2): 25 mg via ORAL
  Filled 2019-07-16 (×2): qty 1

## 2019-07-16 MED ORDER — INSULIN ASPART 100 UNIT/ML ~~LOC~~ SOLN
3.0000 [IU] | Freq: Three times a day (TID) | SUBCUTANEOUS | Status: DC
  Administered 2019-07-17 (×2): 3 [IU] via SUBCUTANEOUS

## 2019-07-16 MED ORDER — ALBUTEROL SULFATE HFA 108 (90 BASE) MCG/ACT IN AERS: 2.0000 | INHALATION_SPRAY | Freq: Four times a day (QID) | RESPIRATORY_TRACT | Status: DC | PRN

## 2019-07-16 MED ORDER — ACETAMINOPHEN 650 MG RE SUPP: 650.0000 mg | Freq: Four times a day (QID) | RECTAL | Status: DC | PRN

## 2019-07-16 MED ORDER — INSULIN ASPART 100 UNIT/ML ~~LOC~~ SOLN
0.0000 [IU] | Freq: Three times a day (TID) | SUBCUTANEOUS | Status: DC
  Administered 2019-07-17: 09:00:00 11 [IU] via SUBCUTANEOUS

## 2019-07-16 MED ORDER — SODIUM CHLORIDE 0.9 % IV SOLN
100.0000 mg | Freq: Every day | INTRAVENOUS | Status: DC
  Administered 2019-07-17 – 2019-07-18 (×2): 100 mg via INTRAVENOUS
  Filled 2019-07-16 (×2): qty 20

## 2019-07-16 MED ORDER — LOSARTAN POTASSIUM 25 MG PO TABS: 25.0000 mg | ORAL_TABLET | Freq: Every day | ORAL | Status: DC

## 2019-07-16 MED ORDER — METFORMIN HCL 500 MG PO TABS: 500.0000 mg | ORAL_TABLET | Freq: Two times a day (BID) | ORAL | Status: DC

## 2019-07-16 MED ORDER — INSULIN GLARGINE 100 UNIT/ML ~~LOC~~ SOLN
15.0000 [IU] | Freq: Every day | SUBCUTANEOUS | Status: DC
  Administered 2019-07-16: 15 [IU] via SUBCUTANEOUS
  Filled 2019-07-16: qty 0.15

## 2019-07-16 MED ORDER — ONDANSETRON HCL 4 MG/2ML IJ SOLN: 4.0000 mg | Freq: Four times a day (QID) | INTRAMUSCULAR | Status: DC | PRN

## 2019-07-16 MED ORDER — ONDANSETRON 4 MG PO TBDP: 4.0000 mg | ORAL_TABLET | Freq: Three times a day (TID) | ORAL | Status: DC | PRN

## 2019-07-16 MED ORDER — DEXAMETHASONE SODIUM PHOSPHATE 10 MG/ML IJ SOLN
6.0000 mg | INTRAMUSCULAR | Status: DC
  Administered 2019-07-17 – 2019-07-18 (×2): 6 mg via INTRAVENOUS
  Filled 2019-07-16 (×2): qty 1

## 2019-07-16 MED ORDER — ENSURE ENLIVE PO LIQD
237.0000 mL | Freq: Two times a day (BID) | ORAL | Status: DC
  Administered 2019-07-17 (×2): 237 mL via ORAL

## 2019-07-16 MED ORDER — REPAGLINIDE 1 MG PO TABS
1.0000 mg | ORAL_TABLET | Freq: Two times a day (BID) | ORAL | Status: DC
  Filled 2019-07-16: qty 1

## 2019-07-16 MED ORDER — DEXAMETHASONE SODIUM PHOSPHATE 10 MG/ML IJ SOLN
10.0000 mg | Freq: Once | INTRAMUSCULAR | Status: AC
  Administered 2019-07-16: 10 mg via INTRAVENOUS
  Filled 2019-07-16: qty 1

## 2019-07-16 MED ORDER — ENOXAPARIN SODIUM 40 MG/0.4ML ~~LOC~~ SOLN
40.0000 mg | SUBCUTANEOUS | Status: DC
  Administered 2019-07-17: 40 mg via SUBCUTANEOUS
  Filled 2019-07-16: qty 0.4

## 2019-07-16 MED ORDER — INSULIN ASPART 100 UNIT/ML ~~LOC~~ SOLN
2.0000 [IU] | Freq: Once | SUBCUTANEOUS | Status: AC
  Administered 2019-07-16: 2 [IU] via SUBCUTANEOUS

## 2019-07-16 MED ORDER — INSULIN ASPART 100 UNIT/ML ~~LOC~~ SOLN
0.0000 [IU] | Freq: Three times a day (TID) | SUBCUTANEOUS | Status: DC
  Administered 2019-07-16: 2 [IU] via SUBCUTANEOUS
  Administered 2019-07-16: 13:00:00 5 [IU] via SUBCUTANEOUS
  Filled 2019-07-16 (×2): qty 1

## 2019-07-16 MED ORDER — GUAIFENESIN ER 600 MG PO TB12
600.0000 mg | ORAL_TABLET | Freq: Two times a day (BID) | ORAL | Status: DC
  Administered 2019-07-16 – 2019-07-18 (×4): 600 mg via ORAL
  Filled 2019-07-16 (×4): qty 1

## 2019-07-16 MED ORDER — DOCUSATE SODIUM 100 MG PO CAPS
100.0000 mg | ORAL_CAPSULE | Freq: Two times a day (BID) | ORAL | Status: DC
  Administered 2019-07-16 – 2019-07-18 (×4): 100 mg via ORAL
  Filled 2019-07-16 (×4): qty 1

## 2019-07-16 MED ORDER — SODIUM CHLORIDE 0.9 % IV SOLN
100.0000 mg | INTRAVENOUS | Status: AC
  Administered 2019-07-16 (×2): 100 mg via INTRAVENOUS
  Filled 2019-07-16 (×2): qty 20

## 2019-07-16 MED ORDER — ENOXAPARIN SODIUM 80 MG/0.8ML ~~LOC~~ SOLN
1.0000 mg/kg | Freq: Once | SUBCUTANEOUS | Status: AC
  Administered 2019-07-16: 13:00:00 75 mg via SUBCUTANEOUS
  Filled 2019-07-16: qty 0.8

## 2019-07-16 MED ORDER — LISINOPRIL 10 MG PO TABS
10.0000 mg | ORAL_TABLET | Freq: Every day | ORAL | Status: DC
  Filled 2019-07-16: qty 1

## 2019-07-16 NOTE — ED Notes (Signed)
Report given to [facility] 

## 2019-07-16 NOTE — H&P (Addendum)
[facility] History and Physical  Patrick Howard WIO:[id number] DOB: 1945/09/14 DOA: 07/15/2019  Referring physician: ED  PCP: Tarri Fuller, MD   Patient is coming from: Home  Chief Complaint: Shortness of breath  HPI: Patrick Howard is a 74 y.o. male past medical history of hypertension, diabetes mellitus, chronic kidney disease stage III presented to med [facility] with complaints of cough, dyspnea shortness of breath for 1 week.  Initially he was assessed in med center on 07/12/2019 for these symptoms when he was not needing any oxygen so he was discharged home.  After being discharged, he continued to feel bad and presented to the hospital on 07/15/2019.  He stated that he feels a short winded especially on exertion has been having some cough but denies any chest pain.  Denies any fever chills or rigor.  He does have impaired appetite but no nausea vomiting or abdominal pain.  He feels fatigued and weak.  He has some chest pain when he takes a deep breath.  Denies any loss of smell or taste.  He states that his wife is also sick with Covid at home.  At [facility] med center repeat chest x-ray showed interval changes with hazy opacities.  Initial chest x-ray on 07/12/2019 did not show any infiltrate.  Patient was also tachypneic tachycardic with elevated inflammatory markers.  Patient was then considered for admission to the hospital for acute hypoxic respiratory failure secondary to Covid pneumonia.  Patient was on 3 L of oxygen.  Today patient feels little better after receiving steroids.  Patient denies any urinary urgency, frequency or dysuria.  Denies any syncope or falls.  Review of Systems:  All systems were reviewed and were negative unless otherwise mentioned in the HPI  Past Medical History:  Diagnosis Date  . Chronic kidney disease (CKD) stage G3a/A1, moderately decreased glomerular filtration rate (GFR) between 45-59 mL/min/1.73 square meter and albuminuria creatinine  ratio less than 30 mg/g   . Diabetes mellitus without complication (HCC)   . History of prostate cancer   . Hypertension    Past Surgical History:  Procedure Laterality Date  . LITHOTRIPSY    . PROSTATECTOMY      Social History:  reports that he has never smoked. He does not have any smokeless tobacco history on file. He reports that he does not drink alcohol or use drugs.  Allergies  Allergen Reactions  . Codeine   . Novocain [Procaine]     Family History  Problem Relation Age of Onset  . Hypertension Mother   . Diabetes Mellitus II Mother      Prior to Admission medications   Medication Sig Start Date End Date Taking? Authorizing Provider  benzonatate (TESSALON) 100 MG capsule Take 1 capsule (100 mg total) by mouth every 8 (eight) hours. 07/12/19   Melene Plan, DO  cefdinir (OMNICEF) 300 MG capsule  07/09/19   [provider]  [employer] ATHLETES FOOT 1 % cream APPLY TO AFFECTED AREA EVERY DAY 04/09/19   [provider]  glimepiride (AMARYL) 4 MG tablet Take 4 mg by mouth every morning. 04/18/19   [provider]  HYDROmorphone (DILAUDID) 2 MG tablet Take 1-2 tablets (2-4 mg total) by mouth every 4 (four) hours as needed for severe pain. 05/08/13   Kuneff, Renee A, DO  Insulin Glargine (BASAGLAR KWIKPEN) 100 UNIT/ML INJECT 12 UNITS INTO THE SKIN NIGHTLY FOR 30 DAYS. 04/10/19   [provider]  JANUMET XR 50-1000 MG TB24 Take  1 tablet by mouth daily. 07/05/19   [provider]  lisinopril (PRINIVIL,ZESTRIL) 10 MG tablet Take 10 mg by mouth daily.    [provider]  metFORMIN (GLUCOPHAGE) 500 MG tablet Take by mouth 2 (two) times daily with a meal.    [provider]  MYRBETRIQ 25 MG TB24 tablet Take 25 mg by mouth daily. 04/09/19   [provider]  ondansetron (ZOFRAN ODT) 4 MG disintegrating tablet 4mg  ODT q4 hours prn nausea/vomit 07/12/19   09/11/19, DO  oxybutynin (DITROPAN-XL) 5 MG 24 hr tablet Take 5 mg by mouth daily.  02/22/19   [provider]  repaglinide (PRANDIN) 1 MG tablet Take 1 mg by mouth 2 (two) times daily before a meal.    [provider]  terbinafine (LAMISIL) 250 MG tablet Take 125 mg by mouth daily. 04/09/19   [provider]    Physical Exam: Vitals:   07/16/19 1230 07/16/19 1400 07/16/19 1404 07/16/19 1540  BP: 131/81 127/73  138/83  Pulse:  84  82  Resp:  (!) 23  19  Temp:   97.7 F (36.5 C) 98.1 F (36.7 C)  TempSrc:   Oral Oral  SpO2:  93%  95%  Weight:      Height:       Wt Readings from Last 3 Encounters:  07/15/19 72.6 kg  07/12/19 74.8 kg  05/08/13 91.2 kg   Body mass index is 22.96 kg/m.  General:  Average built, not in obvious distress, on nasal cannula oxygen HENT: Normocephalic, pupils equally reacting to light and accommodation.  No scleral pallor or icterus noted. Oral mucosa is moist.  Chest: Diminished breath sounds bilaterally.  No obvious crackles or wheezes noted. CVS: S1 &S2 heard. No murmur.  Regular rate and rhythm. Abdomen: Soft, nontender, nondistended.  Bowel sounds are heard.  Liver is not palpable, no abdominal mass palpated Extremities: No cyanosis, clubbing or edema.  Peripheral pulses are palpable. Psych: Alert, awake and oriented, normal mood CNS:  No cranial nerve deficits.  Power equal in all extremities.      Skin: Warm and dry.  No rashes noted.  Labs on Admission:   CBC: Recent Labs  Lab 07/12/19 1902 07/15/19 1149  WBC 6.3 7.9  NEUTROABS 4.9 6.4  HGB 13.9 13.4  HCT 41.8 39.8  MCV 89.1 87.9  PLT 232 274    Basic Metabolic Panel: Recent Labs  Lab 07/12/19 1902 07/15/19 1149  NA 134* 134*  K 4.1 4.2  CL 100 100  CO2 22 22  GLUCOSE 172* 212*  BUN 31* 22  CREATININE 1.59* 1.28*  CALCIUM 9.0 9.4    Liver Function Tests: Recent Labs  Lab 07/12/19 1902 07/15/19 1149  AST 25 26  ALT 17 17  ALKPHOS 77 70  BILITOT 0.7 0.9  PROT 8.2* 7.5  ALBUMIN 4.2 3.5   Recent Labs  Lab  07/15/19 1149  LIPASE 26   No results for input(s): AMMONIA in the last 168 hours.  Cardiac Enzymes: No results for input(s): CKTOTAL, CKMB, CKMBINDEX, TROPONINI in the last 168 hours.  BNP (last 3 results) No results for input(s): BNP in the last 8760 hours.  ProBNP (last 3 results) No results for input(s): PROBNP in the last 8760 hours.  CBG: Recent Labs  Lab 07/12/19 1844 07/16/19 0559 07/16/19 0921 07/16/19 1217  GLUCAP 154* 183* 222* 287*    Lipase     Component Value Date/Time   LIPASE 26 07/15/2019 1149  Urinalysis No results found for: COLORURINE, APPEARANCEUR, LABSPEC, PHURINE, GLUCOSEU, HGBUR, BILIRUBINUR, KETONESUR, PROTEINUR, UROBILINOGEN, NITRITE, LEUKOCYTESUR   Drugs of Abuse  No results found for: LABOPIA, [place], LABBENZ, AMPHETMU, THCU, LABBARB    Radiological Exams on Admission: CT Angio Chest PE W and/or Wo Contrast  Result Date: 07/15/2019 CLINICAL DATA:  Chest pain, abdominal pain, some difficulty breathing, hurts debris, hypertension, diabetes mellitus, diagnosed with COVID-19 three days ago EXAM: CT ANGIOGRAPHY CHEST WITH CONTRAST TECHNIQUE: Multidetector CT imaging of the chest was performed using the standard protocol during bolus administration of intravenous contrast. Multiplanar CT image reconstructions and MIPs were obtained to evaluate the vascular anatomy. CONTRAST:  162mL OMNIPAQUE IOHEXOL 350 MG/ML SOLN IV COMPARISON:  None FINDINGS: Cardiovascular: Atherosclerotic calcifications aorta and minimally in coronary arteries. Aorta normal caliber without aneurysm or dissection. Heart unremarkable. No pericardial effusion. Pulmonary arteries adequately opacified and patent. No evidence of pulmonary embolism. Mediastinum/Nodes: 8 mm RIGHT thyroid nodule image 7; Not clinically significant; no follow-up imaging recommended (ref: J Am Coll Radiol. 2015 Feb;12(2): 143-50).Esophagus unremarkable. Scattered normal size mediastinal lymph nodes.  Enlarged subcarinal node 15 mm image 52. No additional thoracic adenopathy. Lungs/Pleura: Patchy airspace infiltrates greatest in lower lobes, less in lingula and RIGHT middle lobe, consistent with COVID pneumonia. Subpleural nodule RIGHT upper lobe 4 mm diameter image 37. 3 mm RIGHT upper lobe nodule image 29. No pleural effusion or pneumothorax. Upper Abdomen: Multiple BILATERAL renal cysts. Remaining visualized upper abdomen unremarkable. Musculoskeletal: No acute osseous findings. Review of the MIP images confirms the above findings. IMPRESSION: No evidence of pulmonary embolism. Patchy BILATERAL airspace infiltrates consistent with multifocal pneumonia / COVID-19. Nonspecific enlarged subcarinal lymph node. Aortic Atherosclerosis (ICD10-I70.0). Electronically Signed   By: Lavonia Dana M.D.   On: 07/15/2019 14:51   DG Chest Portable 1 View  Result Date: 07/15/2019 CLINICAL DATA:  COVID. Additional history provided: Diagnosed with COVID last Friday, abdominal pain with some difficulty breathing, pain with breathing, history of diabetes and hypertension. EXAM: PORTABLE CHEST 1 VIEW COMPARISON:  Chest radiograph 07/12/2019. FINDINGS: There are ill-defined opacities within the bilateral lung bases which are new as compared to prior examination 07/12/2019 and consistent with the provided history of COVID pneumonia. No evidence of pleural effusion or pneumothorax. Heart size within normal limits. Aortic atherosclerosis. No acute bony abnormality identified IMPRESSION: Hazy opacities at the bilateral lung bases consistent with the provided history of COVID pneumonia. Aortic Atherosclerosis (ICD10-I70.0). Electronically Signed   By: Kellie Simmering DO   On: 07/15/2019 10:08    EKG: Personally reviewed by me which shows normal sinus rhythm  Assessment/Plan Principal Problem:   Pneumonia due to COVID-19 virus Active Problems:   Respiratory failure, acute ([place])   Hypertension   Diabetes mellitus without  complication ([place])   Acute hypoxic respiratory failure secondary to Covid pneumonia.  On 3 L of oxygen at this time.  Patient is already on remdesivir and IV steroids.  CRP is elevated at 18.9.Marland Kitchen  Procalcitonin is negative.  Elevated D-dimer at 2.5.  Lactate 1.2.  WBC normal limits.  Will follow daily inflammatory biomarkers.  Since the patient has felt better and is stable on 3 L of oxygen will not administer Actemra today.  If he has worsening hypoxia or CRP could consider Actemra.  Patient does not have any contraindications for Actemra.  COVID-19 Labs  Recent Labs    07/15/19 1149  DDIMER 2.55*  FERRITIN 667*  LDH 221*  CRP 18.9*    Lab Results  Component Value  Date   SARSCOV2NAA POSITIVE (A) 07/12/2019    Diabetes mellitus type 2 hyperglycemia.  We will put the patient on sliding scale insulin, long-acting and mealtime insulin since the patient will also be on steroids.  Accu-Cheks diabetic diet.  We will closely monitor.  Will hold oral hypoglycemic now.  Patient is on Lantus Janumet and glimepiride at home.  Last known hemoglobin A1c of 11.5 from 07/16/2019.  Essential hypertension.  Will closely monitor.  Resume lisinopril  CKD stage IIIa.    We will continue to monitor closely.  Check BMP in a.m.  History of prostate cancer.  Many years back.  Treated with complete prostate resection.  No residual disease.  No radiation of chemotherapy been given.  DVT Prophylaxis: Lovenox subcu  Consultant: None  Code Status: Full code  Microbiology blood cultures negative in less than 24 hours  Antibiotics: None  Family Communication:  Patients' condition and plan of care including tests being ordered have been discussed with the patient and the patient's wife on the phone who indicate understanding and agree with the plan.  Status is: Inpatient  Remains inpatient appropriate because:IV treatments appropriate due to intensity of illness or inability to take PO, Inpatient level of  care appropriate due to severity of illness and Acute hypoxic respiratory failure secondary to Covid pneumonia   Dispo: The patient is from: Home              Anticipated d/c is to: Home              Anticipated d/c date is: > 3 days              Patient currently is not medically stable to d/c.   Severity of Illness: The appropriate patient status for this patient is INPATIENT. Inpatient status is judged to be reasonable and necessary in order to provide the required intensity of service to ensure the patient's safety. The patient's presenting symptoms, physical exam findings, and initial radiographic and laboratory data in the context of their chronic comorbidities is felt to place them at high risk for further clinical deterioration. Furthermore, it is not anticipated that the patient will be medically stable for discharge from the hospital within 2 midnights of admission. Patient require inpatient hospital care spanning beyond 2 midnights from the point of admission due to high intensity of service, high risk for further deterioration and high frequency of surveillance required.   Signed,  Joycelyn Das, MD [facility] 07/16/2019

## 2019-07-16 NOTE — ED Provider Notes (Signed)
Patient holding in the ED with Covid pneumonia and hypoxia.  He is in no distress on nasal cannula.  Excessive delay in obtaining a bed.    Home medications are ordered including sliding scale insulin. Patient is not certain what his home diabetes medications are.  We will hold at this point and just use insulin.  Patient was not given Decadron or remdesivir and these are ordered as well.  BP 111/68   Pulse 78   Temp 97.7 F (36.5 C) (Oral)   Resp 15   Ht 5\' 10"  (1.778 m)   Wt 72.6 kg   SpO2 98%   BMI 22.96 kg/m     , MD 07/16/19 [phone]

## 2019-07-16 NOTE — ED Notes (Signed)
ED Provider at bedside. 

## 2019-07-16 NOTE — ED Provider Notes (Signed)
Pt waiting in ED extended time.  I reevaluated pt.  Pt reports he is breathing okay on oxygen.  Pt's 02 sat is 94 on 3 liters.  Pt has not had any bleeding, no black stool,  Lovenox ordered.  Rn discussing transfer and bed placement.    Elson Areas, [place] 07/16/19 1118    Little, Ambrose Finland, MD 07/20/19 [phone]

## 2019-07-17 LAB — GLUCOSE, CAPILLARY
Glucose-Capillary: 310 mg/dL — ABNORMAL HIGH (ref 70–99)
Glucose-Capillary: 436 mg/dL — ABNORMAL HIGH (ref 70–99)
Glucose-Capillary: 418 mg/dL — ABNORMAL HIGH (ref 70–99)
Glucose-Capillary: 480 mg/dL — ABNORMAL HIGH (ref 70–99)
Glucose-Capillary: 347 mg/dL — ABNORMAL HIGH (ref 70–99)

## 2019-07-17 LAB — COMPREHENSIVE METABOLIC PANEL
ALT: 21 U/L (ref 0–44)
AST: 27 U/L (ref 15–41)
Albumin: 3.4 g/dL — ABNORMAL LOW (ref 3.5–5.0)
Alkaline Phosphatase: 69 U/L (ref 38–126)
Anion gap: 11 (ref 5–15)
BUN: 39 mg/dL — ABNORMAL HIGH (ref 8–23)
CO2: 22 mmol/L (ref 22–32)
Calcium: 9.7 mg/dL (ref 8.9–10.3)
Chloride: 102 mmol/L (ref 98–111)
Creatinine, Ser: 1.39 mg/dL — ABNORMAL HIGH (ref 0.61–1.24)
GFR calc Af Amer: 57 mL/min — ABNORMAL LOW (ref >60)
GFR calc non Af Amer: 50 mL/min — ABNORMAL LOW (ref >60)
Glucose, Bld: 314 mg/dL — ABNORMAL HIGH (ref 70–99)
Potassium: 4.6 mmol/L (ref 3.5–5.1)
Sodium: 135 mmol/L (ref 135–145)
Total Bilirubin: 0.6 mg/dL (ref 0.3–1.2)
Total Protein: 7.6 g/dL (ref 6.5–8.1)

## 2019-07-17 LAB — CULTURE, BLOOD (ROUTINE X 2)
Culture: NO GROWTH
Culture: NO GROWTH
Special Requests: ADEQUATE
Special Requests: ADEQUATE

## 2019-07-17 LAB — CBC WITH DIFFERENTIAL/PLATELET
Abs Immature Granulocytes: 0 10*3/uL (ref 0.00–0.07)
Basophils Absolute: 0 10*3/uL (ref 0.0–0.1)
Basophils Relative: 0 %
Eosinophils Absolute: 0 10*3/uL (ref 0.0–0.5)
Eosinophils Relative: 0 %
HCT: 38.9 % — ABNORMAL LOW (ref 39.0–52.0)
Hemoglobin: 12.6 g/dL — ABNORMAL LOW (ref 13.0–17.0)
Lymphocytes Relative: 22 %
Lymphs Abs: 1.3 10*3/uL (ref 0.7–4.0)
MCH: 28.9 pg (ref 26.0–34.0)
MCHC: 32.4 g/dL (ref 30.0–36.0)
MCV: 89.2 fL (ref 80.0–100.0)
Monocytes Absolute: 0.4 10*3/uL (ref 0.1–1.0)
Monocytes Relative: 7 %
Neutro Abs: 4.2 10*3/uL (ref 1.7–7.7)
Neutrophils Relative %: 71 %
Platelets: 354 10*3/uL (ref 150–400)
RBC: 4.36 MIL/uL (ref 4.22–5.81)
RDW: 13 % (ref 11.5–15.5)
WBC: 5.9 10*3/uL (ref 4.0–10.5)
nRBC: 0 % (ref 0.0–0.2)

## 2019-07-17 LAB — HEMOGLOBIN A1C
Hgb A1c MFr Bld: 11.4 % — ABNORMAL HIGH (ref 4.8–5.6)
Mean Plasma Glucose: 280.48 mg/dL

## 2019-07-17 LAB — BRAIN NATRIURETIC PEPTIDE: B Natriuretic Peptide: 37.5 pg/mL (ref 0.0–100.0)

## 2019-07-17 LAB — C-REACTIVE PROTEIN: CRP: 9.7 mg/dL — ABNORMAL HIGH (ref <1.0)

## 2019-07-17 LAB — D-DIMER, QUANTITATIVE: D-Dimer, Quant: 1.95 ug/mL-FEU — ABNORMAL HIGH (ref 0.00–0.50)

## 2019-07-17 LAB — FERRITIN: Ferritin: 723 ng/mL — ABNORMAL HIGH (ref 24–336)

## 2019-07-17 LAB — MAGNESIUM: Magnesium: 2 mg/dL (ref 1.7–2.4)

## 2019-07-17 LAB — TSH: TSH: 6.347 u[IU]/mL — ABNORMAL HIGH (ref 0.350–4.500)

## 2019-07-17 MED ORDER — INSULIN ASPART 100 UNIT/ML ~~LOC~~ SOLN
10.0000 [IU] | Freq: Three times a day (TID) | SUBCUTANEOUS | Status: DC
  Administered 2019-07-17 – 2019-07-18 (×3): 10 [IU] via SUBCUTANEOUS

## 2019-07-17 MED ORDER — INSULIN ASPART 100 UNIT/ML ~~LOC~~ SOLN
10.0000 [IU] | Freq: Once | SUBCUTANEOUS | Status: AC
  Administered 2019-07-17: 13:00:00 10 [IU] via INTRAVENOUS

## 2019-07-17 MED ORDER — INSULIN GLARGINE 100 UNIT/ML ~~LOC~~ SOLN
40.0000 [IU] | Freq: Every day | SUBCUTANEOUS | Status: DC
  Administered 2019-07-17: 40 [IU] via SUBCUTANEOUS
  Filled 2019-07-17: qty 0.4

## 2019-07-17 MED ORDER — INSULIN ASPART 100 UNIT/ML ~~LOC~~ SOLN
10.0000 [IU] | Freq: Once | SUBCUTANEOUS | Status: AC
  Administered 2019-07-17: 10 [IU] via SUBCUTANEOUS

## 2019-07-17 MED ORDER — ADULT MULTIVITAMIN W/MINERALS CH
1.0000 | ORAL_TABLET | Freq: Every day | ORAL | Status: DC
  Administered 2019-07-17 – 2019-07-18 (×2): 1 via ORAL
  Filled 2019-07-17: qty 1

## 2019-07-17 MED ORDER — INSULIN GLARGINE 100 UNIT/ML ~~LOC~~ SOLN: 20.0000 [IU] | Freq: Every day | SUBCUTANEOUS | Status: DC

## 2019-07-17 MED ORDER — INSULIN ASPART 100 UNIT/ML ~~LOC~~ SOLN
0.0000 [IU] | Freq: Three times a day (TID) | SUBCUTANEOUS | Status: DC
  Administered 2019-07-18: 15 [IU] via SUBCUTANEOUS
  Administered 2019-07-18: 4 [IU] via SUBCUTANEOUS

## 2019-07-17 NOTE — Progress Notes (Signed)
Pt up ambulating in room, on RA, tolerating rounds without issues, O2 on RA 90% while ambulating and 97 % at rest. Will cont to monitor. SRP,RN

## 2019-07-17 NOTE — Progress Notes (Addendum)
PROGRESS NOTE    Patrick Howard  EXB:[id number] DOB: 1946-02-11 DOA: 07/15/2019 PCP: Rubie Maid, MD   Brief Narrative:  Patient is a 58 male with history of hypertension, diabetes type 2, prostate cancer who presents to the emergency department at [facility] who presented with complaints of dyspnea, cough, fever.  Symptoms started about a week ago.  His wife is also sick.  He presented to [facility] on 07/12/2019 and was diagnosed with Covid but was discharged because he did not require any oxygen.  Patient went home, continued to feel bad that he represented today.  Chest x-ray on 07/16/2019 does not show any pneumonia but chest x-ray done today showed hazy opacities at the bilateral lung bases. On presentation he was tachypneic, tachycardic but maintaining his saturation on room air.  Creatinine of 1.28 .  Elevated inflammatory markers with CRP of 18.9.    Patient was admitted for Covid pneumonia.  Started on remdesivir and Decadron.  Assessment & Plan:   Principal Problem:   Pneumonia due to COVID-19 virus Active Problems:   Respiratory failure, acute ([place])   Hypertension   Diabetes mellitus without complication ([place])   Acute hypoxic respiratory failure secondary to Covid pneumonia: Currently on 2 L of oxygen. Presented with worsening dyspnea, cough, fever.  Wife is also sick at home.  Chest x-ray showed bilateral hazy opacities consistent with pneumonia.  Elevated inflammatory markers with CRP of 18.9 on presentation.  Procalcitonin negative. Continue monitoring inflammatory markers. Improving. Started on Decadron and remdesivir.  Day 2 If worsening hypoxia CRP, will consider Actemra.  Diabetes type 2 with hyperglycemia: On insulin at home.  Also takes Janumet and glimepiride, his diabetes looks uncontrolled.  Hemoglobin A1c of 11.5.  Continue monitor CBG, he is on Decadron.  Became very hyperglycemic today and his insulin dose was increased.  Hypertension:  Currently blood pressure stable.  On lisinopril  Stage IIIa CKD: Currently kidney function at baseline.  Continue to monitor  History of prostate  cancer: Currently in remission.  Treated with complete prostate  resection, no residual disease.         DVT prophylaxis:Lovenox Code Status: Full Family Communication: Called and discussed with wife on phone,she is also sick with Covid and not feeling well today.  I advised her to come to the emergency department if she continues to feel short of breath Status is: Inpatient  Remains inpatient appropriate because:IV treatments appropriate due to intensity of illness or inability to take PO   Dispo: The patient is from: Home              Anticipated d/c is to: Home              Anticipated d/c date is: 2 days              Patient currently is not medically stable to d/c. Anticipating to finish the course of remdesivir during this hospitalization.  he is on supplemental oxygen    Consultants: None  Procedures: None  Antimicrobials:  Anti-infectives (From admission, onward)   Start     Dose/Rate Route Frequency Ordered Stop   07/17/19 1000  remdesivir 100 mg in sodium chloride 0.9 % 100 mL IVPB     100 mg 200 mL/hr over 30 Minutes Intravenous Daily 07/16/19 0605 07/21/19 0959   07/16/19 0630  remdesivir 100 mg in sodium chloride 0.9 % 100 mL IVPB     100 mg 200 mL/hr over 30 Minutes  Intravenous Every 30 min 07/16/19 0605 07/16/19 0810      Subjective:  Patient seen and examined the bedside this morning.  Appears comfortable.  Sitting on the bed.  Not in any respiratory distress.  He was frustrated with himself for getting Covid.  He says that he is feeling better today.  2 L of oxygen per minute.   Objective: Vitals:   07/16/19 2223 07/17/19 0219 07/17/19 0512 07/17/19 0757  BP: 131/83 131/87 (!) 123/107 (!) 143/83  Pulse: 70 63 62 64  Resp: 20 20 (!) 22   Temp: 98.4 F (36.9 C) 97.6 F (36.4 C) 97.6 F (36.4 C)     TempSrc: Oral Oral Oral   SpO2: 94% 92% 99%   Weight:      Height:        Intake/Output Summary (Last 24 hours) at 07/17/2019 0819 Last data filed at 07/16/2019 1700 Gross per 24 hour  Intake --  Output 350 ml  Net -350 ml   Filed Weights   07/15/19 0935 07/15/19 0947  Weight: 79 kg 72.6 kg    Examination:  General exam: Appears calm and comfortable ,Not in distress,average built HEENT:PERRL,Oral mucosa moist, Ear/Nose normal on gross exam Respiratory system: No obvious wheezing or crackles, diminished air sounds on the bases  cardiovascular system: S1 & S2 heard, RRR. No JVD, murmurs, rubs, gallops or clicks. No pedal edema. Gastrointestinal system: Abdomen is nondistended, soft and nontender. No organomegaly or masses felt. Normal bowel sounds heard. Central nervous system: Alert and oriented. No focal neurological deficits. Extremities: No edema, no clubbing ,no cyanosis, distal peripheral pulses palpable. Skin: No rashes, lesions or ulcers,no icterus ,no pallor   Data Reviewed: I have personally reviewed following labs and imaging studies  CBC: Recent Labs  Lab 07/12/19 1902 07/15/19 1149 07/17/19 0345  WBC 6.3 7.9 5.9  NEUTROABS 4.9 6.4 4.2  HGB 13.9 13.4 12.6*  HCT 41.8 39.8 38.9*  MCV 89.1 87.9 89.2  PLT 232 274 314   Basic Metabolic Panel: Recent Labs  Lab 07/12/19 1902 07/15/19 1149 07/17/19 0345  NA 134* 134* 135  K 4.1 4.2 4.6  CL 100 100 102  CO2 _0 GLUCOSE 172* 212* 314*  BUN 31* 22 39*  CREATININE 1.59* 1.28* 1.39*  CALCIUM 9.0 9.4 9.7  MG  --   --  2.0   GFR: Estimated Creatinine Clearance: 47.9 mL/min (A) (by C-G formula based on SCr of 1.39 mg/dL (H)). Liver Function Tests: Recent Labs  Lab 07/12/19 1902 07/15/19 1149 07/17/19 0345  AST _1 ALT _2 ALKPHOS 77 70 69  BILITOT 0.7 0.9 0.6  PROT 8.2* 7.5 7.6  ALBUMIN 4.2 3.5 3.4*   Recent Labs  Lab 07/15/19 1149  LIPASE 26   No results for input(s):  AMMONIA in the last 168 hours. Coagulation Profile: Recent Labs  Lab 07/12/19 1902  INR 0.9   Cardiac Enzymes: No results for input(s): CKTOTAL, CKMB, CKMBINDEX, TROPONINI in the last 168 hours. BNP (last 3 results) No results for input(s): PROBNP in the last 8760 hours. HbA1C: Recent Labs    07/16/19 0555 07/17/19 0345  HGBA1C 11.5* 11.4*   CBG: Recent Labs  Lab 07/16/19 0921 07/16/19 1217 07/16/19 1656 07/16/19 2114 07/17/19 0756  GLUCAP 222* 287* 325* 408* 310*   Lipid Profile: Recent Labs    07/15/19 1149  TRIG 197*   Thyroid Function Tests: Recent Labs    07/17/19 0345  TSH  6.347*   Anemia Panel: Recent Labs    07/15/19 1149 07/17/19 0345  FERRITIN 667* 723*   Sepsis Labs: Recent Labs  Lab 07/12/19 1902 07/15/19 1149  PROCALCITON  --  <0.10  LATICACIDVEN 1.2 1.2    Recent Results (from the past 240 hour(s))  Blood Culture (routine x 2)     Status: None   Collection Time: 07/12/19  7:02 PM   Specimen: BLOOD  Result Value Ref Range Status   Specimen Description BLOOD LEFT ANTECUBITAL  Final   Special Requests   Final    BOTTLES DRAWN AEROBIC AND ANAEROBIC Blood Culture adequate volume Performed at [facility], [address] [place], [place] [address]    Culture   Final    NO GROWTH 5 DAYS Performed at [facility], [place] [address] [place], [place] [address]    Report Status 07/17/2019 FINAL  Final  SARS Coronavirus 2 Ag (30 min TAT) - Nasal Swab (BD Veritor Kit)     Status: None   Collection Time: 07/12/19  7:02 PM   Specimen: Nasal Swab (BD Veritor Kit)  Result Value Ref Range Status   SARS Coronavirus 2 Ag NEGATIVE NEGATIVE Final    Comment: (NOTE) SARS-CoV-2 antigen NOT DETECTED.  Negative results are presumptive.  Negative results do not preclude SARS-CoV-2 infection and should not be used as the sole basis for treatment or other patient management decisions, including infection  control decisions,  particularly in the presence of clinical signs and  symptoms consistent with COVID-19, or in those who have been in contact with the virus.  Negative results must be combined with clinical observations, patient history, and epidemiological information. The expected result is Negative. Fact Sheet for Patients: PodPark.tn Fact Sheet for Healthcare Providers: GiftContent.is This test is not yet approved or cleared by the [place] FDA and  has been authorized for detection and/or diagnosis of SARS-CoV-2 by FDA under an Emergency Use Authorization (EUA).  This EUA will remain in effect (meaning this test can be used) for the duration of  the COVID-19 de claration under Section 564(b)(1) of the Act, 21 U.S.C. section 360bbb-3(b)(1), unless the authorization is terminated or revoked sooner. Performed at [facility], [address] [place], [place] [address]   Respiratory Panel by RT PCR (Flu A&B, Covid) - Nasopharyngeal Swab     Status: Abnormal   Collection Time: 07/12/19  8:15 PM   Specimen: Nasopharyngeal Swab  Result Value Ref Range Status   SARS Coronavirus 2 by RT PCR POSITIVE (A) NEGATIVE Final    Comment: RESULT CALLED TO, READ BACK BY AND VERIFIED WITH: NEAL,K AT 2116 ON [id number] BY CHERESNOWSKY,T (NOTE) SARS-CoV-2 target nucleic acids are DETECTED. SARS-CoV-2 RNA is generally detectable in upper respiratory specimens  during the acute phase of infection. Positive results are indicative of the presence of the identified virus, but do not rule out bacterial infection or co-infection with other pathogens not detected by the test. Clinical correlation with patient history and other diagnostic information is necessary to determine patient infection status. The expected result is Negative. Fact Sheet for Patients:  PinkCheek.be Fact Sheet for Healthcare  Providers: GravelBags.it This test is not yet approved or cleared by the [place] FDA and  has been authorized for detection and/or diagnosis of SARS-CoV-2 by FDA under an Emergency Use Authorization (EUA).  This EUA will remain in effect (meaning this test can be  used) for the duration of  the  COVID-19 declaration under Section 564(b)(1) of the Act, 21 U.S.C. section 360bbb-3(b)(1), unless the authorization is terminated or revoked sooner.    Influenza A by PCR NEGATIVE NEGATIVE Final   Influenza B by PCR NEGATIVE NEGATIVE Final    Comment: (NOTE) The Xpert Xpress SARS-CoV-2/FLU/RSV assay is intended as an aid in  the diagnosis of influenza from Nasopharyngeal swab specimens and  should not be used as a sole basis for treatment. Nasal washings and  aspirates are unacceptable for Xpert Xpress SARS-CoV-2/FLU/RSV  testing. Fact Sheet for Patients: PinkCheek.be Fact Sheet for Healthcare Providers: GravelBags.it This test is not yet approved or cleared by the [place] FDA and  has been authorized for detection and/or diagnosis of SARS-CoV-2 by  FDA under an Emergency Use Authorization (EUA). This EUA will remain  in effect (meaning this test can be used) for the duration of the  Covid-19 declaration under Section 564(b)(1) of the Act, 21  U.S.C. section 360bbb-3(b)(1), unless the authorization is  terminated or revoked. Performed at [facility], [address] [place], [place] [address]   Culture, blood (Routine X 2) w Reflex to ID Panel     Status: None   Collection Time: 07/12/19  8:20 PM   Specimen: BLOOD  Result Value Ref Range Status   Specimen Description BLOOD RIGHT ANTECUBITAL  Final   Special Requests   Final    BOTTLES DRAWN AEROBIC AND ANAEROBIC Blood Culture adequate volume Performed at [facility], [address] [place], [place] [address]     Culture   Final    NO GROWTH 5 DAYS Performed at [facility], [place] [address], [place], [place] [address]    Report Status 07/17/2019 FINAL  Final  Blood Culture (routine x 2)     Status: None (Preliminary result)   Collection Time: 07/15/19 11:49 AM   Specimen: BLOOD  Result Value Ref Range Status   Specimen Description   Final    BLOOD RIGHT ARM Performed at [facility], [address] [place], [place] [address]    Special Requests   Final    BOTTLES DRAWN AEROBIC AND ANAEROBIC Blood Culture adequate volume Performed at [facility], [address] [place], [place] [address]    Culture   Final    NO GROWTH 2 DAYS Performed at [facility], [place] [address], [place], [place] [address]    Report Status PENDING  Incomplete  Blood Culture (routine x 2)     Status: None (Preliminary result)   Collection Time: 07/15/19 12:15 PM   Specimen: BLOOD RIGHT FOREARM  Result Value Ref Range Status   Specimen Description   Final    BLOOD RIGHT FOREARM Performed at [facility], [address] [place], [place] [address]    Special Requests   Final    BOTTLES DRAWN AEROBIC AND ANAEROBIC Blood Culture adequate volume Performed at [facility], [address] [place], [place] [address]    Culture   Final    NO GROWTH 2 DAYS Performed at [facility], [place] [address], [place], [place] [address]    Report Status PENDING  Incomplete         Radiology Studies: CT Angio Chest PE W and/or Wo Contrast  Result Date: 07/15/2019 CLINICAL DATA:  Chest pain, abdominal pain, some difficulty breathing, hurts debris, hypertension, diabetes mellitus, diagnosed with COVID-19 three days  ago EXAM: CT ANGIOGRAPHY CHEST WITH CONTRAST TECHNIQUE: Multidetector CT imaging of the chest was performed using the standard protocol during bolus administration of intravenous contrast. Multiplanar CT image reconstructions and MIPs were obtained to  evaluate the vascular anatomy. CONTRAST:  179m OMNIPAQUE IOHEXOL 350 MG/ML SOLN IV COMPARISON:  None FINDINGS: Cardiovascular: Atherosclerotic calcifications aorta and minimally in coronary arteries. Aorta normal caliber without aneurysm or dissection. Heart unremarkable. No pericardial effusion. Pulmonary arteries adequately opacified and patent. No evidence of pulmonary embolism. Mediastinum/Nodes: 8 mm RIGHT thyroid nodule image 7; Not clinically significant; no follow-up imaging recommended (ref: J Am Coll Radiol. 2015 Feb;12(2): 143-50).Esophagus unremarkable. Scattered normal size mediastinal lymph nodes. Enlarged subcarinal node 15 mm image 52. No additional thoracic adenopathy. Lungs/Pleura: Patchy airspace infiltrates greatest in lower lobes, less in lingula and RIGHT middle lobe, consistent with COVID pneumonia. Subpleural nodule RIGHT upper lobe 4 mm diameter image 37. 3 mm RIGHT upper lobe nodule image 29. No pleural effusion or pneumothorax. Upper Abdomen: Multiple BILATERAL renal cysts. Remaining visualized upper abdomen unremarkable. Musculoskeletal: No acute osseous findings. Review of the MIP images confirms the above findings. IMPRESSION: No evidence of pulmonary embolism. Patchy BILATERAL airspace infiltrates consistent with multifocal pneumonia / COVID-19. Nonspecific enlarged subcarinal lymph node. Aortic Atherosclerosis (ICD10-I70.0). Electronically Signed   By: MLavonia DanaM.D.   On: 07/15/2019 14:51   DG Chest Portable 1 View  Result Date: 07/15/2019 CLINICAL DATA:  COVID. Additional history provided: Diagnosed with COVID last Friday, abdominal pain with some difficulty breathing, pain with breathing, history of diabetes and hypertension. EXAM: PORTABLE CHEST 1 VIEW COMPARISON:  Chest radiograph 07/12/2019. FINDINGS: There are ill-defined opacities within the bilateral lung bases which are new as compared to prior examination 07/12/2019 and consistent with the provided history of COVID  pneumonia. No evidence of pleural effusion or pneumothorax. Heart size within normal limits. Aortic atherosclerosis. No acute bony abnormality identified IMPRESSION: Hazy opacities at the bilateral lung bases consistent with the provided history of COVID pneumonia. Aortic Atherosclerosis (ICD10-I70.0). Electronically Signed   By: KKellie SimmeringDO   On: 07/15/2019 10:08        Scheduled Meds: . dexamethasone (DECADRON) injection  6 mg Intravenous Q24H  . docusate sodium  100 mg Oral BID  . enoxaparin (LOVENOX) injection  40 mg Subcutaneous Q24H  . feeding supplement (ENSURE ENLIVE)  237 mL Oral BID BM  . guaiFENesin  600 mg Oral BID  . insulin aspart  0-15 Units Subcutaneous TID WC  . insulin aspart  0-5 Units Subcutaneous QHS  . insulin aspart  3 Units Subcutaneous TID WC  . insulin glargine  20 Units Subcutaneous Q2200  . losartan  25 mg Oral Daily  . oxybutynin  5 mg Oral Daily   Continuous Infusions: . remdesivir 100 mg in NS 100 mL       LOS: 2 days    Time spent:25 mins. More than 50% of that time was spent in counseling and/or coordination of care.      AShelly Coss MD [facility] P5/02/2020, 8:19 AM

## 2019-07-17 NOTE — Plan of Care (Signed)
  Problem: Education: Goal: Knowledge of risk factors and measures for prevention of condition will improve Outcome: Adequate for Discharge   Problem: Education: Goal: Knowledge of General Education information will improve Description: Including pain rating scale, medication(s)/side effects and non-pharmacologic comfort measures Outcome: Adequate for Discharge   Problem: Nutrition: Goal: Adequate nutrition will be maintained Outcome: Adequate for Discharge   Problem: Nutrition: Goal: Adequate nutrition will be maintained Outcome: Adequate for Discharge

## 2019-07-17 NOTE — Progress Notes (Signed)
CBG 436, MD updated; orders received. SRP, RN

## 2019-07-17 NOTE — Progress Notes (Addendum)
Inpatient Diabetes Program Recommendations  AACE/ADA: New Consensus Statement on Inpatient Glycemic Control (2015)  Target Ranges:  Prepandial:   less than 140 mg/dL      Peak postprandial:   less than 180 mg/dL (1-2 hours)      Critically ill patients:  140 - 180 mg/dL   Lab Results  Component Value Date   GLUCAP 310 (H) 07/17/2019   HGBA1C 11.4 (H) 07/17/2019    Review of Glycemic Control Results for Patrick Howard, Patrick Howard (MRN [id number]) as of 07/17/2019 10:58  Ref. Range 07/16/2019 12:17 07/16/2019 16:56 07/16/2019 21:14 07/17/2019 07:56  Glucose-Capillary Latest Ref Range: 70 - 99 mg/dL 242 (H) 353 (H) 614 (H) 310 (H)   Diabetes history: DM 2/+COVID Outpatient Diabetes medications: Amaryl 4 mg daily, Basaglar 20 units daily, Janumet 50/1000 daily-(NOT TAKING) Current orders for Inpatient glycemic control:  Lantus 20 units q HS, Novolog 3 units tid with meals, Novolog moderate tid with meals and HS Decadron 6 mg daily Inpatient Diabetes Program Recommendations:   Anticipate that insulin needs will be higher due to steroids.  If appropriate, consider increasing Novolog meal coverage to 6 units tid with meals.  Also consider adding Tradjenta 5 mg daily.  Agree with increase in Lantus (may need further increase based on fasting blood sugars).   Thanks  Beryl Meager, RN, BC-ADM Inpatient Diabetes Coordinator Pager [phone] (8a-5p)  Addendum:  Called and spoke with patient regarding DM.  Chart review indicates that patient's A1C on April 09, 2019 was 14.8%.  Patient was started on Basaglar and Janumet was stopped (due to GI upset).  A1C is somewhat improved but not to goal and still indicates uncontrolled DM.  Discussed with patient by phone.  He is not feeling well and just confirmed home medications.  Encouraged f/u with PCP and also reminded him of goal blood sugars and A1C.  He may even need Meal coverage at d/c especially if he goes home on steroids. Will follow.

## 2019-07-17 NOTE — Progress Notes (Addendum)
Initial Nutrition Assessment  RD working remotely.   DOCUMENTATION CODES:   Not applicable  INTERVENTION:  - will order Hormel Shake with breakfast meals, each supplement provides 500 kcal and 22 grams protein. - will order daily multivitamin with minerals.    NUTRITION DIAGNOSIS:   Increased nutrient needs related to acute illness, catabolic illness(COVID-19 infection) as evidenced by estimated needs.  GOAL:   Patient will meet greater than or equal to 90% of their needs  MONITOR:   PO intake, Supplement acceptance, Labs, Weight trends  REASON FOR ASSESSMENT:   Malnutrition Screening Tool  ASSESSMENT:   74 year-old male with history of HTN, type 2 DM, and prostate cancer. He presented to the ED at [facility] with complaints of dyspnea, cough, and fever which began ~1 week ago. He was diagnosed with COVID-19 at [facility] on 07/12/19. CXR showed hazy opacities at the bilateral lung bases. He was admitted due to COVID PNA and started on remdesivir and decadron.  Patient consumed 80% of breakfast this AM (350 kcal, 17 grams protein).  Per chart review, weight on 5/10 was 174 lb. Weight on 5/7 was 164 lb. On 04/09/19 at [facility] he weighed 184 lb. This indicates 10 lb weight loss (5% body weight) in the past 3 months; not significant for time frame.   Per notes: - acute hypoxic respiratory failure 2/2 COVID PNA - hyperglycemia with hx of type 2 DM--HgbA1c: 11.5% - hx of prostate cancer--in remission   Labs reviewed; CBGs: 310, 436, and 418 mg/dl, BUN: 39 mg/dl, creatinine: 5.18 mg/dl, GFR: 50 ml/min. Medications reviewed; 100 mg colace BID, sliding scale novolog, 10 units novolog TID, 40 units lantus/day, 100 mg IV remdesivir x2 doses 5/11, 100 mg IV remdesivir x1 dose/day (5/12-5/15)    NUTRITION - FOCUSED PHYSICAL EXAM:  unable to complete at this time.   Diet Order:   Diet Order            Diet Carb Modified Fluid consistency: Thin; Room service  appropriate? Yes  Diet effective now              EDUCATION NEEDS:   No education needs have been identified at this time  Skin:  Skin Assessment: Reviewed RN Assessment  Last BM:  5/11  Height:   Ht Readings from Last 1 Encounters:  07/15/19 5\' 10"  (1.778 m)    Weight:   Wt Readings from Last 1 Encounters:  07/15/19 72.6 kg    Ideal Body Weight:  74.5 kg  BMI:  Body mass index is 22.96 kg/m.  Estimated Nutritional Needs:   Kcal:  1800-2000 kcal  Protein:  90-105 grams  Fluid:  >/= 2 L/day     09/14/19, MS, RD, LDN, CNSC Inpatient Clinical Dietitian RD pager # available in [facility]  After hours/weekend pager # available in [facility]

## 2019-07-17 NOTE — TOC Progression Note (Signed)
Transition of Care [facility]) - Progression Note    Patient Details  Name: Patrick Howard MRN: [id number] Date of Birth: Dec 20, 1945  Transition of Care [facility] CM/SW Contact  Geni Bers, RN Phone Number: 07/17/2019, 10:09 AM  Clinical Narrative:    Pt is from home with his spouse and plan to discharge home. Pt admitted with SOB, Pneumonia due to COVID 19 on 07/06/19. TOC will continue to follow for discharge needs.    Expected Discharge Plan: Home/Self Care Barriers to Discharge: No Barriers Identified  Expected Discharge Plan and Services Expected Discharge Plan: Home/Self Care       Living arrangements for the past 2 months: Single Family Home                                       Social Determinants of Health (SDOH) Interventions    Readmission Risk Interventions No flowsheet data found.

## 2019-07-18 LAB — CBC WITH DIFFERENTIAL/PLATELET
Abs Immature Granulocytes: 0.08 10*3/uL — ABNORMAL HIGH (ref 0.00–0.07)
Basophils Absolute: 0 10*3/uL (ref 0.0–0.1)
Basophils Relative: 0 %
Eosinophils Absolute: 0 10*3/uL (ref 0.0–0.5)
Eosinophils Relative: 0 %
HCT: 38.5 % — ABNORMAL LOW (ref 39.0–52.0)
Hemoglobin: 12.4 g/dL — ABNORMAL LOW (ref 13.0–17.0)
Immature Granulocytes: 1 %
Lymphocytes Relative: 17 %
Lymphs Abs: 1.8 10*3/uL (ref 0.7–4.0)
MCH: 29.2 pg (ref 26.0–34.0)
MCHC: 32.2 g/dL (ref 30.0–36.0)
MCV: 90.8 fL (ref 80.0–100.0)
Monocytes Absolute: 0.9 10*3/uL (ref 0.1–1.0)
Monocytes Relative: 8 %
Neutro Abs: 8.2 10*3/uL — ABNORMAL HIGH (ref 1.7–7.7)
Neutrophils Relative %: 74 %
Platelets: 377 10*3/uL (ref 150–400)
RBC: 4.24 MIL/uL (ref 4.22–5.81)
RDW: 13.1 % (ref 11.5–15.5)
WBC: 11 10*3/uL — ABNORMAL HIGH (ref 4.0–10.5)
nRBC: 0 % (ref 0.0–0.2)

## 2019-07-18 LAB — COMPREHENSIVE METABOLIC PANEL
ALT: 19 U/L (ref 0–44)
AST: 19 U/L (ref 15–41)
Albumin: 3.4 g/dL — ABNORMAL LOW (ref 3.5–5.0)
Alkaline Phosphatase: 59 U/L (ref 38–126)
Anion gap: 10 (ref 5–15)
BUN: 47 mg/dL — ABNORMAL HIGH (ref 8–23)
CO2: 22 mmol/L (ref 22–32)
Calcium: 10.2 mg/dL (ref 8.9–10.3)
Chloride: 104 mmol/L (ref 98–111)
Creatinine, Ser: 1.34 mg/dL — ABNORMAL HIGH (ref 0.61–1.24)
GFR calc Af Amer: >60 mL/min (ref >60)
GFR calc non Af Amer: 52 mL/min — ABNORMAL LOW (ref >60)
Glucose, Bld: 250 mg/dL — ABNORMAL HIGH (ref 70–99)
Potassium: 4.9 mmol/L (ref 3.5–5.1)
Sodium: 136 mmol/L (ref 135–145)
Total Bilirubin: 0.8 mg/dL (ref 0.3–1.2)
Total Protein: 7.1 g/dL (ref 6.5–8.1)

## 2019-07-18 LAB — C-REACTIVE PROTEIN: CRP: 3.4 mg/dL — ABNORMAL HIGH (ref <1.0)

## 2019-07-18 LAB — MAGNESIUM: Magnesium: 1.9 mg/dL (ref 1.7–2.4)

## 2019-07-18 LAB — FERRITIN: Ferritin: 597 ng/mL — ABNORMAL HIGH (ref 24–336)

## 2019-07-18 LAB — GLUCOSE, CAPILLARY
Glucose-Capillary: 183 mg/dL — ABNORMAL HIGH (ref 70–99)
Glucose-Capillary: 321 mg/dL — ABNORMAL HIGH (ref 70–99)

## 2019-07-18 MED ORDER — DEXAMETHASONE 6 MG PO TABS
6.0000 mg | ORAL_TABLET | Freq: Every day | ORAL | 0 refills | Status: AC
Start: 2019-07-18 — End: 2019-07-25

## 2019-07-18 MED ORDER — GUAIFENESIN ER 600 MG PO TB12: 600.0000 mg | ORAL_TABLET | Freq: Two times a day (BID) | ORAL | 0 refills | Status: AC

## 2019-07-18 MED ORDER — INSULIN ASPART 100 UNIT/ML ~~LOC~~ SOLN: 6.0000 [IU] | Freq: Three times a day (TID) | SUBCUTANEOUS | 11 refills | Status: AC

## 2019-07-18 MED ORDER — BASAGLAR KWIKPEN 100 UNIT/ML ~~LOC~~ SOPN: 20.0000 [IU] | PEN_INJECTOR | Freq: Every day | SUBCUTANEOUS | 0 refills | Status: AC

## 2019-07-18 NOTE — Plan of Care (Signed)
  Problem: Respiratory: Goal: Will maintain a patent airway Outcome: Completed/Met Goal: Complications related to the disease process, condition or treatment will be avoided or minimized Outcome: Completed/Met   Problem: Health Behavior/Discharge Planning: Goal: Ability to manage health-related needs will improve Outcome: Completed/Met   Problem: Clinical Measurements: Goal: Ability to maintain clinical measurements within normal limits will improve Outcome: Completed/Met Goal: Will remain free from infection Outcome: Progressing Goal: Diagnostic test results will improve Outcome: Progressing Goal: Respiratory complications will improve Outcome: Progressing

## 2019-07-18 NOTE — Discharge Instructions (Signed)
10 Things You Can Do to Manage Your COVID-19 Symptoms at Home If you have possible or confirmed COVID-19: 1. Stay home from work and school. And stay away from other public places. If you must go out, avoid using any kind of public transportation, ridesharing, or taxis. 2. Monitor your symptoms carefully. If your symptoms get worse, call your healthcare provider immediately. 3. Get rest and stay hydrated. 4. If you have a medical appointment, call the healthcare provider ahead of time and tell them that you have or may have COVID-19. 5. For medical emergencies, call 911 and notify the dispatch personnel that you have or may have COVID-19. 6. Cover your cough and sneezes with a tissue or use the inside of your elbow. 7. Wash your hands often with soap and water for at least 20 seconds or clean your hands with an alcohol-based hand sanitizer that contains at least 60% alcohol. 8. As much as possible, stay in a specific room and away from other people in your home. Also, you should use a separate bathroom, if available. If you need to be around other people in or outside of the home, wear a mask. 9. Avoid sharing personal items with other people in your household, like dishes, towels, and bedding. 10. Clean all surfaces that are touched often, like counters, tabletops, and doorknobs. Use household cleaning sprays or wipes according to the label instructions. cdc.gov/coronavirus 09/05/2018 This information is not intended to replace advice given to you by your health care provider. Make sure you discuss any questions you have with your health care provider. Document Revised: 02/07/2019 Document Reviewed: 02/07/2019 Elsevier Patient Education  2020 Else[employer] Inc.  COVID-19: How to Protect Yourself and Others Know how it spreads  There is currently no vaccine to prevent coronavirus disease 2019 (COVID-19).  The best way to prevent illness is to avoid being exposed to this virus.  The virus is  thought to spread mainly from person-to-person. ? Between people who are in close contact with one another (within about 6 feet). ? Through respiratory droplets produced when an infected person coughs, sneezes or talks. ? These droplets can land in the mouths or noses of people who are nearby or possibly be inhaled into the lungs. ? COVID-19 may be spread by people who are not showing symptoms. Everyone should Clean your hands often  Wash your hands often with soap and water for at least 20 seconds especially after you have been in a public place, or after blowing your nose, coughing, or sneezing.  If soap and water are not readily available, use a hand sanitizer that contains at least 60% alcohol. Cover all surfaces of your hands and rub them together until they feel dry.  Avoid touching your eyes, nose, and mouth with unwashed hands. Avoid close contact  Limit contact with others as much as possible.  Avoid close contact with people who are sick.  Put distance between yourself and other people. ? Remember that some people without symptoms may be able to spread virus. ? This is especially important for people who are at higher risk of getting very sick.www.cdc.gov/coronavirus/2019-ncov/need-extra-precautions/people-at-higher-risk.html Cover your mouth and nose with a mask when around others  You could spread COVID-19 to others even if you do not feel sick.  Everyone should wear a mask in public settings and when around people not living in their household, especially when social distancing is difficult to maintain. ? Masks should not be placed on young children under age 2, anyone who   has trouble breathing, or is unconscious, incapacitated or otherwise unable to remove the mask without assistance.  The mask is meant to protect other people in case you are infected.  Do NOT use a facemask meant for a Dietitian.  Continue to keep about 6 feet between yourself and others. The  mask is not a substitute for social distancing. Cover coughs and sneezes  Always cover your mouth and nose with a tissue when you cough or sneeze or use the inside of your elbow.  Throw used tissues in the trash.  Immediately wash your hands with soap and water for at least 20 seconds. If soap and water are not readily available, clean your hands with a hand sanitizer that contains at least 60% alcohol. Clean and disinfect  Clean AND disinfect frequently touched surfaces daily. This includes tables, doorknobs, light switches, countertops, handles, desks, phones, keyboards, toilets, faucets, and sinks. [email or web address]  If surfaces are dirty, clean them: Use detergent or soap and water prior to disinfection.  Then, use a household disinfectant. You can see a list of EPA-registered household disinfectants here. [email or web address] 11/07/2018 This information is not intended to replace advice given to you by your health care provider. Make sure you discuss any questions you have with your health care provider. Document Revised: 11/15/2018 Document Reviewed: 09/13/2018 Elsevier Patient Education  [place]: Quarantine vs. Isolation QUARANTINE keeps someone who was in close contact with someone who has COVID-19 away from others. If you had close contact with a person who has COVID-19  Stay home until 14 days after your last contact.  Check your temperature twice a day and watch for symptoms of COVID-19.  If possible, stay away from people who are at higher-risk for getting very sick from COVID-19. ISOLATION keeps someone who is sick or tested positive for COVID-19 without symptoms away from others, even in their own home. If you are sick and think or know you have COVID-19  Stay home until after ? At least 10 days since symptoms first appeared and ? At least 24 hours with no fever without  fever-reducing medication and ? Symptoms have improved If you tested positive for COVID-19 but do not have symptoms  Stay home until after ? 10 days have passed since your positive test If you live with others, stay in a specific "sick room" or area and away from other people or animals, including pets. Use a separate bathroom, if available. [email or web address] 09/24/2018 This information is not intended to replace advice given to you by your health care provider. Make sure you discuss any questions you have with your health care provider. Document Revised: 02/07/2019 Document Reviewed: 02/07/2019 Elsevier Patient Education  [place] if You Are Sick If you are sick with COVID-19 or think you might have COVID-19, follow the steps below to care for yourself and to help protect other people in your home and community. Stay home except to get medical care.  Stay home. Most people with COVID-19 have mild illness and are able to recover at home without medical care. Do not leave your home, except to get medical care. Do not visit public areas.  Take care of yourself. Get rest and stay hydrated. Take over-the-counter medicines, such as acetaminophen, to help you feel better.  Stay in touch with your doctor. Call before you get medical care. Be sure to get care if you have trouble breathing, or have any other emergency warning signs,  or if you think it is an emergency.  Avoid public transportation, ride-sharing, or taxis. Separate yourself from other people and pets in your home.  As much as possible, stay in a specific room and away from other people and pets in your home. Also, you should use a separate bathroom, if available. If you need to be around other people or animals in or outside of the home, wear a mask. ? See COVID-19 and Animals if you have questions about [email or web address]. ? Additional guidance  is available for those living in close quarters. (http://white.info/.html) and shared housing (DisasterTalk.co.uk). Monitor your symptoms.  Symptoms of COVID-19 include fever, cough, and shortness of breath but other symptoms may be present as well.  Follow care instructions from your healthcare provider and local health department. Your local health authorities will give instructions on checking your symptoms and reporting information. When to Seek Emergency Medical Attention Look for emergency warning signs* for COVID-19. If someone is showing any of these signs, seek emergency medical care immediately:  Trouble breathing  Persistent pain or pressure in the chest  New confusion  Bluish lips or face  Inability to wake or stay awake *This list is not all possible symptoms. Please call your medical provider for any other symptoms that are severe or concerning to you. Call 911 or call ahead to your local emergency facility: Notify the operator that you are seeking care for someone who has or may have COVID-19. Call ahead before visiting your doctor.  Call ahead. Many medical visits for routine care are being postponed or done by phone or telemedicine.  If you have a medical appointment that cannot be postponed, call your doctor's office, and tell them you have or may have COVID-19. If you are sick, wear a mask over your nose and mouth.  You should wear a mask over your nose and mouth if you must be around other people or animals, including pets (even at home).  You don't need to wear the mask if you are alone. If you can't put on a mask (because of trouble breathing for example), cover your coughs and sneezes in some other way. Try to stay at least 6 feet away from other people. This will help protect the people around you.  Masks should not be placed on  young children under age 44 years, anyone who has trouble breathing, or anyone who is not able to remove the mask without help. Note: During the COVID-19 pandemic, medical grade facemasks are reserved for healthcare workers and some first responders. You may need to make a mask using a scarf or bandana. Cover your coughs and sneezes.  Cover your mouth and nose with a tissue when you cough or sneeze.  Throw used tissues in a lined trash can.  Immediately wash your hands with soap and water for at least 20 seconds. If soap and water are not available, clean your hands with an alcohol-based hand sanitizer that contains at least 60% alcohol. Clean your hands often.  Wash your hands often with soap and water for at least 20 seconds. This is especially important after blowing your nose, coughing, or sneezing; going to the bathroom; and before eating or preparing food.  Use hand sanitizer if soap and water are not available. Use an alcohol-based hand sanitizer with at least 60% alcohol, covering all surfaces of your hands and rubbing them together until they feel dry.  Soap and water are the best option, especially if your hands are visibly dirty.  Avoid touching your eyes, nose, and mouth with unwashed hands. Avoid sharing personal household items.  Do not share dishes, drinking glasses, cups, eating utensils, towels, or bedding with other people in your home.  Wash these items thoroughly after using them with soap and water or put them in the dishwasher. Clean all "high-touch" surfaces everyday.  Clean and disinfect high-touch surfaces in your "sick room" and bathroom. Let someone else clean and disinfect surfaces in common areas, but not your bedroom and bathroom.  If a caregiver or other person needs to clean and disinfect a sick person's bedroom or bathroom, they should do so on an as-needed basis. The caregiver/other person should wear a mask and wait as long as possible after the sick  person has used the bathroom. High-touch surfaces include phones, remote controls, counters, tabletops, doorknobs, bathroom fixtures, toilets, keyboards, tablets, and bedside tables.  Clean and disinfect areas that may have blood, stool, or body fluids on them.  Use household cleaners and disinfectants. Clean the area or item with soap and water or another detergent if it is dirty. Then use a household disinfectant. ? Be sure to follow the instructions on the label to ensure safe and effective use of the product. Many products recommend keeping the surface wet for several minutes to ensure germs are killed. Many also recommend precautions such as wearing gloves and making sure you have good ventilation during use of the product. ? Most EPA-registered household disinfectants should be effective. When you can be around others after you had or likely had COVID-19 When you can be around others (end home isolation) depends on different factors for different situations.  I think or know I had COVID-19, and I had symptoms ? You can be with others after  24 hours with no fever AND  Symptoms improved AND  10 days since symptoms first appeared ? Depending on your healthcare provider's advice and availability of testing, you might get tested to see if you still have COVID-19. If you will be tested, you can be around others when you have no fever, symptoms have improved, and you receive two negative test results in a row, at least 24 hours apart.  I tested positive for COVID-19 but had no symptoms ? If you continue to have no symptoms, you can be with others after:  10 days have passed since test ? Depending on your healthcare provider's advice and availability of testing, you might get tested to see if you still have COVID-19. If you will be tested, you can be around others after you receive two negative test results in a row, at least 24 hours apart. ? If you develop symptoms after testing positive,  follow the guidance above for "I think or know I had COVID, and I had symptoms." [email or web address] 10/16/2018 This information is not intended to replace advice given to you by your health care provider. Make sure you discuss any questions you have with your health care provider. Document Revised: 11/01/2018 Document Reviewed: 09/04/2018 Elsevier Patient Education  2020 [employer].

## 2019-07-18 NOTE — Progress Notes (Signed)
Discharge instructions reviewed with pt, questions addressed, acknowledged understanding. SRP, RN

## 2019-07-18 NOTE — Care Management Important Message (Signed)
Important Message  Patient Details IM Letter given to Ezekiel Ina RN Case Manager to present to the Patient Name: Patrick Howard MRN: [id number] Date of Birth: 03-Mar-1946   Medicare Important Message Given:  Yes     Caren Macadam 07/18/2019, 10:16 AM

## 2019-07-18 NOTE — Discharge Summary (Signed)
Discharge Summary  Patrick Howard IHW:[id number] DOB: February 19, 1946  PCP: Rubie Maid, MD  Admit date: 07/15/2019 Discharge date: 07/18/2019   Recommendations for Outpatient Follow-up:  1. PCP 2 weeks 2. Patient has been signed up for [place] home monitoring program as well.   Discharge Diagnoses:  Active Hospital Problems   Diagnosis Date Noted  . Pneumonia due to COVID-19 virus 07/15/2019  . Respiratory failure, acute ([place]) 07/16/2019  . Hypertension   . Diabetes mellitus without complication [facility])     Resolved Hospital Problems  No resolved problems to display.    Discharge Condition: Stable   Diet recommendation: DM2   Vitals:   07/17/19 2107 07/18/19 0607  BP: 107/71 109/83  Pulse: (!) 59 60  Resp: 15 20  Temp: 98.2 F (36.8 C) (!) 97.5 F (36.4 C)  SpO2: 92% 95%    HPI and Brief Hospital Course:  Patient is a 76 male with history of hypertension, diabetes type 2, prostate cancer who [facility] who presented with complaints of dyspnea, cough,fever. Symptoms started about a week ago. His wife is also sick. He presented to [facility] on 07/12/2019 and was diagnosedwith Covidbutwas discharged because he did not require any oxygen. Patient went home, continuedto feel bad that he represented today.Chest x-ray on 07/16/2019 does not show any pneumonia but chest x-ray done today showedhazy opacities at the bilateral lung bases. On presentation he was tachypneic, tachycardic but maintaining his saturation on room air. Creatinine of 1.28 . Elevated inflammatory markers with CRP of 18.9.   Patient was admitted for Covid pneumonia.  Started on remdesivir and Decadron.   Today he is doing well, has been on room air since last night eating well and ambulating in his room. Inflammatory markers are improved. He would like to go home. He hasn't slept well and feeling jittery due to steroids. His insulin  was increased and BG is better. Will be sent home today to complete 10 days of oral steroids, with increased basaglar and premeal insulin. No need to continue Remdesivir beyond hospital discharge. I spoke to both him and his wife about the need to keep a close eye on blood sugars especially after his steroids are stopped to make sure he is not getting hypoglycemic.  Discharge details, plan of care and follow up instructions were discussed with patient and his wife over the phone. Patient and family are in agreement with discharge from the hospital today and all questions were answered to their satisfaction.  Discharge Exam: BP 109/83 (BP Location: Left Arm)   Pulse 60   Temp (!) 97.5 F (36.4 C) (Oral)   Resp 20   Ht 5' 10"  (1.778 m)   Wt 72.6 kg   SpO2 95%   BMI 22.96 kg/m  General:  Alert, oriented, calm, in no acute distress, speaking full sentences on room air Eyes: EOMI, clear sclerea Neck: supple, no masses, trachea mildline  Cardiovascular: RRR, no murmurs or rubs, no peripheral edema  Respiratory: clear to auscultation bilaterally, no wheezes, no crackles  Abdomen: soft, nontender, nondistended, normal bowel tones heard  Skin: dry, no rashes  Musculoskeletal: no joint effusions, normal range of motion  Psychiatric: appropriate affect, normal speech  Neurologic: extraocular muscles intact, clear speech, moving all extremities with intact sensorium    Discharge Instructions You were cared for by a hospitalist during your hospital stay. If you have any questions about your discharge medications or the care you  received while you were in the hospital after you are discharged, you can call the unit and asked to speak with the hospitalist on call if the hospitalist that took care of you is not available. Once you are discharged, your primary care physician will handle any further medical issues. Please note that NO REFILLS for any discharge medications will be authorized once you are  discharged, as it is imperative that you return to your primary care physician (or establish a relationship with a primary care physician if you do not have one) for your aftercare needs so that they can reassess your need for medications and monitor your lab values.  Discharge Instructions    Call MD for:  difficulty breathing, headache or visual disturbances   Complete by: As directed    Diet - low sodium heart healthy   Complete by: As directed    Increase activity slowly   Complete by: As directed      Allergies as of 07/18/2019      Reactions   Codeine    Janumet [sitagliptin-metformin Hcl] Swelling   Novocain [procaine]       Medication List    STOP taking these medications   cefdinir 300 MG capsule Commonly known as: OMNICEF     TAKE these medications   Basaglar KwikPen 100 UNIT/ML Inject 0.2 mLs (20 Units total) into the skin at bedtime for 7 days. What changed: how much to take   benzonatate 100 MG capsule Commonly known as: TESSALON Take 1 capsule (100 mg total) by mouth every 8 (eight) hours.   dexamethasone 6 MG tablet Commonly known as: DECADRON Take 1 tablet (6 mg total) by mouth daily for 7 days.   glimepiride 4 MG tablet Commonly known as: AMARYL Take 4 mg by mouth every morning.   guaiFENesin 600 MG 12 hr tablet Commonly known as: MUCINEX Take 1 tablet (600 mg total) by mouth 2 (two) times daily for 7 days.   losartan 100 MG tablet Commonly known as: COZAAR Take 25 mg by mouth daily.   ondansetron 4 MG disintegrating tablet Commonly known as: Zofran ODT 51m ODT q4 hours prn nausea/vomit   oxybutynin 5 MG 24 hr tablet Commonly known as: DITROPAN-XL Take 5 mg by mouth daily.      Allergies  Allergen Reactions  . Codeine   . Janumet [Sitagliptin-Metformin Hcl] Swelling  . Novocain [Procaine]       The results of significant diagnostics from this hospitalization (including imaging, microbiology, ancillary and laboratory) are listed  below for reference.    Significant Diagnostic Studies: CT Head Wo Contrast  Result Date: 07/12/2019 CLINICAL DATA:  Pt very vague in triage. Reports cough x several weeks that is intermittent. Reports intermittent abdominal pain with intermittent N/V. Reports fever intermittently over the last 2 weeks tmax 100.7. EXAM: CT HEAD WITHOUT CONTRAST TECHNIQUE: Contiguous axial images were obtained from the base of the skull through the vertex without intravenous contrast. COMPARISON:  None. FINDINGS: Brain: No evidence of acute infarction, hemorrhage, hydrocephalus, extra-axial collection or mass lesion/mass effect. There is ventricular and sulcal enlargement reflecting mild atrophy. Small old lacunar infarct projects near the genu of the left internal capsule. Mild patchy areas of white matter hypoattenuation also noted consistent with chronic microvascular ischemic change. Vascular: No hyperdense vessel or unexpected calcification. Skull: Normal. Negative for fracture or focal lesion. Sinuses/Orbits: Normal globes and orbits. Mild right maxillary sinus mucosal thickening with dependent fluid. Remaining sinuses are clear. Other: None. IMPRESSION: 1. No  acute intracranial abnormalities. 2. 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 3. Right maxillary sinus mucosal thickening with dependent fluid. Consider acute sinusitis in the proper clinical setting. Electronically Signed   By: Lajean Manes M.D.   On: 07/12/2019 19:46   CT Angio Chest PE W and/or Wo Contrast  Result Date: 07/15/2019 CLINICAL DATA:  Chest pain, abdominal pain, some difficulty breathing, hurts debris, hypertension, diabetes mellitus, diagnosed with COVID-19 three days ago EXAM: CT ANGIOGRAPHY CHEST WITH CONTRAST TECHNIQUE: Multidetector CT imaging of the chest was performed using the standard protocol during bolus administration of intravenous contrast. Multiplanar CT image  reconstructions and MIPs were obtained to evaluate the vascular anatomy. CONTRAST:  174m OMNIPAQUE IOHEXOL 350 MG/ML SOLN IV COMPARISON:  None FINDINGS: Cardiovascular: Atherosclerotic calcifications aorta and minimally in coronary arteries. Aorta normal caliber without aneurysm or dissection. Heart unremarkable. No pericardial effusion. Pulmonary arteries adequately opacified and patent. No evidence of pulmonary embolism. Mediastinum/Nodes: 8 mm RIGHT thyroid nodule image 7; Not clinically significant; no follow-up imaging recommended (ref: J Am Coll Radiol. 2015 Feb;12(2): 143-50).Esophagus unremarkable. Scattered normal size mediastinal lymph nodes. Enlarged subcarinal node 15 mm image 52. No additional thoracic adenopathy. Lungs/Pleura: Patchy airspace infiltrates greatest in lower lobes, less in lingula and RIGHT middle lobe, consistent with COVID pneumonia. Subpleural nodule RIGHT upper lobe 4 mm diameter image 37. 3 mm RIGHT upper lobe nodule image 29. No pleural effusion or pneumothorax. Upper Abdomen: Multiple BILATERAL renal cysts. Remaining visualized upper abdomen unremarkable. Musculoskeletal: No acute osseous findings. Review of the MIP images confirms the above findings. IMPRESSION: No evidence of pulmonary embolism. Patchy BILATERAL airspace infiltrates consistent with multifocal pneumonia / COVID-19. Nonspecific enlarged subcarinal lymph node. Aortic Atherosclerosis (ICD10-I70.0). Electronically Signed   By: MLavonia DanaM.D.   On: 07/15/2019 14:51   DG Chest Portable 1 View  Result Date: 07/15/2019 CLINICAL DATA:  COVID. Additional history provided: Diagnosed with COVID last Friday, abdominal pain with some difficulty breathing, pain with breathing, history of diabetes and hypertension. EXAM: PORTABLE CHEST 1 VIEW COMPARISON:  Chest radiograph 07/12/2019. FINDINGS: There are ill-defined opacities within the bilateral lung bases which are new as compared to prior examination 07/12/2019 and  consistent with the provided history of COVID pneumonia. No evidence of pleural effusion or pneumothorax. Heart size within normal limits. Aortic atherosclerosis. No acute bony abnormality identified IMPRESSION: Hazy opacities at the bilateral lung bases consistent with the provided history of COVID pneumonia. Aortic Atherosclerosis (ICD10-I70.0). Electronically Signed   By: KKellie SimmeringDO   On: 07/15/2019 10:08   DG Chest Port 1 View  Result Date: 07/12/2019 CLINICAL DATA:  Intermittent fever and cough, and nausea and vomiting for several days. EXAM: PORTABLE CHEST 1 VIEW COMPARISON:  None. FINDINGS: Cardiac silhouette is normal in size. No mediastinal or hilar masses or evidence of adenopathy. Lungs are clear.  No pleural effusion or pneumothorax. Skeletal structures are grossly intact. IMPRESSION: No active disease. Electronically Signed   By: DLajean ManesM.D.   On: 07/12/2019 19:56    Microbiology: Recent Results (from the past 240 hour(s))  Blood Culture (routine x 2)     Status: None   Collection Time: 07/12/19  7:02 PM   Specimen: BLOOD  Result Value Ref Range Status   Specimen Description BLOOD LEFT ANTECUBITAL  Final   Special Requests   Final    BOTTLES DRAWN AEROBIC AND ANAEROBIC Blood Culture adequate volume Performed at [facility] [address] [place], [place] [address]    Culture   Final    NO GROWTH 5 DAYS Performed at [facility] [address] [place], [place] [address]    Report Status 07/17/2019 FINAL  Final  SARS Coronavirus 2 Ag (30 min TAT) - Nasal Swab (BD Veritor Kit)     Status: None   Collection Time: 07/12/19  7:02 PM   Specimen: Nasal Swab (BD Veritor Kit)  Result Value Ref Range Status   SARS Coronavirus 2 Ag NEGATIVE NEGATIVE Final    Comment: (NOTE) SARS-CoV-2 antigen NOT DETECTED.  Negative results are presumptive.  Negative results do not preclude SARS-CoV-2 infection and should not be used as the sole basis for treatment or  other patient management decisions, including infection  control decisions, particularly in the presence of clinical signs and  symptoms consistent with COVID-19, or in those who have been in contact with the virus.  Negative results must be combined with clinical observations, patient history, and epidemiological information. The expected result is Negative. Fact Sheet for Patients: PodPark.tn Fact Sheet for Healthcare Providers: GiftContent.is This test is not yet approved or cleared by the [place] FDA and  has been authorized for detection and/or diagnosis of SARS-CoV-2 by FDA under an Emergency Use Authorization (EUA).  This EUA will remain in effect (meaning this test can be used) for the duration of  the COVID-19 de claration under Section 564(b)(1) of the Act, 21 U.S.C. section 360bbb-3(b)(1), unless the authorization is terminated or revoked sooner. Performed at [facility], [address] [place], [place] [address]   Respiratory Panel by RT PCR (Flu A&B, Covid) - Nasopharyngeal Swab     Status: Abnormal   Collection Time: 07/12/19  8:15 PM   Specimen: Nasopharyngeal Swab  Result Value Ref Range Status   SARS Coronavirus 2 by RT PCR POSITIVE (A) NEGATIVE Final    Comment: RESULT CALLED TO, READ BACK BY AND VERIFIED WITH: NEAL,K AT 2116 ON [id number] BY CHERESNOWSKY,T (NOTE) SARS-CoV-2 target nucleic acids are DETECTED. SARS-CoV-2 RNA is generally detectable in upper respiratory specimens  during the acute phase of infection. Positive results are indicative of the presence of the identified virus, but do not rule out bacterial infection or co-infection with other pathogens not detected by the test. Clinical correlation with patient history and other diagnostic information is necessary to determine patient infection status. The expected result is Negative. Fact Sheet for Patients:    PinkCheek.be Fact Sheet for Healthcare Providers: GravelBags.it This test is not yet approved or cleared by the [place] FDA and  has been authorized for detection and/or diagnosis of SARS-CoV-2 by FDA under an Emergency Use Authorization (EUA).  This EUA will remain in effect (meaning this test can be  used) for the duration of  the COVID-19 declaration under Section 564(b)(1) of the Act, 21 U.S.C. section 360bbb-3(b)(1), unless the authorization is terminated or revoked sooner.    Influenza A by PCR NEGATIVE NEGATIVE Final   Influenza B by PCR NEGATIVE NEGATIVE Final    Comment: (NOTE) The Xpert Xpress SARS-CoV-2/FLU/RSV assay is intended as an aid in  the diagnosis of influenza from Nasopharyngeal swab specimens and  should not be used as a sole basis for treatment. Nasal washings and  aspirates are unacceptable for Xpert Xpress SARS-CoV-2/FLU/RSV  testing. Fact Sheet for Patients: PinkCheek.be Fact Sheet for Healthcare Providers: GravelBags.it This test is not yet approved or cleared by the [place] and  has been authorized for detection and/or diagnosis of SARS-CoV-2 by  FDA under an Emergency Use Authorization (EUA). This EUA will remain  in effect (meaning this test can be used) for the duration of the  Covid-19 declaration under Section 564(b)(1) of the Act, 21  U.S.C. section 360bbb-3(b)(1), unless the authorization is  terminated or revoked. Performed at [facility], [address] [place], [place] [address]   Culture, blood (Routine X 2) w Reflex to ID Panel     Status: None   Collection Time: 07/12/19  8:20 PM   Specimen: BLOOD  Result Value Ref Range Status   Specimen Description BLOOD RIGHT ANTECUBITAL  Final   Special Requests   Final    BOTTLES DRAWN AEROBIC AND ANAEROBIC Blood Culture adequate volume Performed at [facility], [address] [place], [place] [address]    Culture   Final    NO GROWTH 5 DAYS Performed at [facility], [place] [address] [place], [place] [address]    Report Status 07/17/2019 FINAL  Final  Blood Culture (routine x 2)     Status: None (Preliminary result)   Collection Time: 07/15/19 11:49 AM   Specimen: BLOOD  Result Value Ref Range Status   Specimen Description   Final    BLOOD RIGHT ARM Performed at [facility], [address] [place], [place] [address]    Special Requests   Final    BOTTLES DRAWN AEROBIC AND ANAEROBIC Blood Culture adequate volume Performed at [facility], [address] [place], [place] [address]    Culture   Final    NO GROWTH 2 DAYS Performed at [facility], [place] [address] [place], [place] [address]    Report Status PENDING  Incomplete  Blood Culture (routine x 2)     Status: None (Preliminary result)   Collection Time: 07/15/19 12:15 PM   Specimen: BLOOD RIGHT FOREARM  Result Value Ref Range Status   Specimen Description   Final    BLOOD RIGHT FOREARM Performed at [facility], [address] [place], [place] [address]    Special Requests   Final    BOTTLES DRAWN AEROBIC AND ANAEROBIC Blood Culture adequate volume Performed at [facility], [address] [place], [place] [address]    Culture   Final    NO GROWTH 2 DAYS Performed at [facility], [place] [address], [place], [place] [address]    Report Status PENDING  Incomplete     Labs: Basic Metabolic Panel: Recent Labs  Lab 07/12/19 1902 07/15/19 1149 07/17/19 0345 07/18/19 0401  NA 134* 134* 135 136  K 4.1 4.2 4.6 4.9  CL 100 100 102 104  CO2 22 22 22 22   GLUCOSE 172* 212* 314* 250*  BUN 31* 22 39* 47*  CREATININE 1.59* 1.28* 1.39* 1.34*  CALCIUM 9.0 9.4 9.7 10.2  MG  --   --  2.0 1.9   Liver Function Tests: Recent Labs  Lab 07/12/19 1902 07/15/19 1149 07/17/19 0345 07/18/19 0401    AST 25 26 27 19   ALT 17 17 21 19   ALKPHOS 77 70 69 59  BILITOT 0.7 0.9 0.6 0.8  PROT 8.2* 7.5 7.6 7.1  ALBUMIN 4.2 3.5 3.4* 3.4*   Recent Labs  Lab 07/15/19 1149  LIPASE 26   No results for input(s): AMMONIA in the last 168 hours. CBC:  Recent Labs  Lab 07/12/19 1902 07/15/19 1149 07/17/19 0345 07/18/19 0401  WBC 6.3 7.9 5.9 11.0*  NEUTROABS 4.9 6.4 4.2 8.2*  HGB 13.9 13.4 12.6* 12.4*  HCT 41.8 39.8 38.9* 38.5*  MCV 89.1 87.9 89.2 90.8  PLT 232 274 354 377   Cardiac Enzymes: No results for input(s): CKTOTAL, CKMB, CKMBINDEX, TROPONINI in the last 168 hours. BNP: BNP (last 3 results) Recent Labs    07/17/19 0345  BNP 37.5    ProBNP (last 3 results) No results for input(s): PROBNP in the last 8760 hours.  CBG: Recent Labs  Lab 07/17/19 1227 07/17/19 1325 07/17/19 1653 07/17/19 2101 07/18/19 0755  GLUCAP 436* 418* 480* 347* 183*    Time spent: 36 minutes were spent in preparing this discharge including medication reconciliation, counseling, and coordination of care.  Signed:  Ailea Rhatigan Marry Guan, MD  [facility] 07/18/2019, 8:55 AM

## 2019-07-20 LAB — CULTURE, BLOOD (ROUTINE X 2)
Culture: NO GROWTH
Culture: NO GROWTH
Special Requests: ADEQUATE
Special Requests: ADEQUATE

## 2021-08-28 IMAGING — DX DG CHEST 1V PORT
1 series · 1 of 1 positions shown · non-contrast
Comparison: None.

CLINICAL DATA: Intermittent fever and cough, and nausea and
vomiting for several days.

EXAM:
PORTABLE CHEST 1 VIEW

[chest ap]
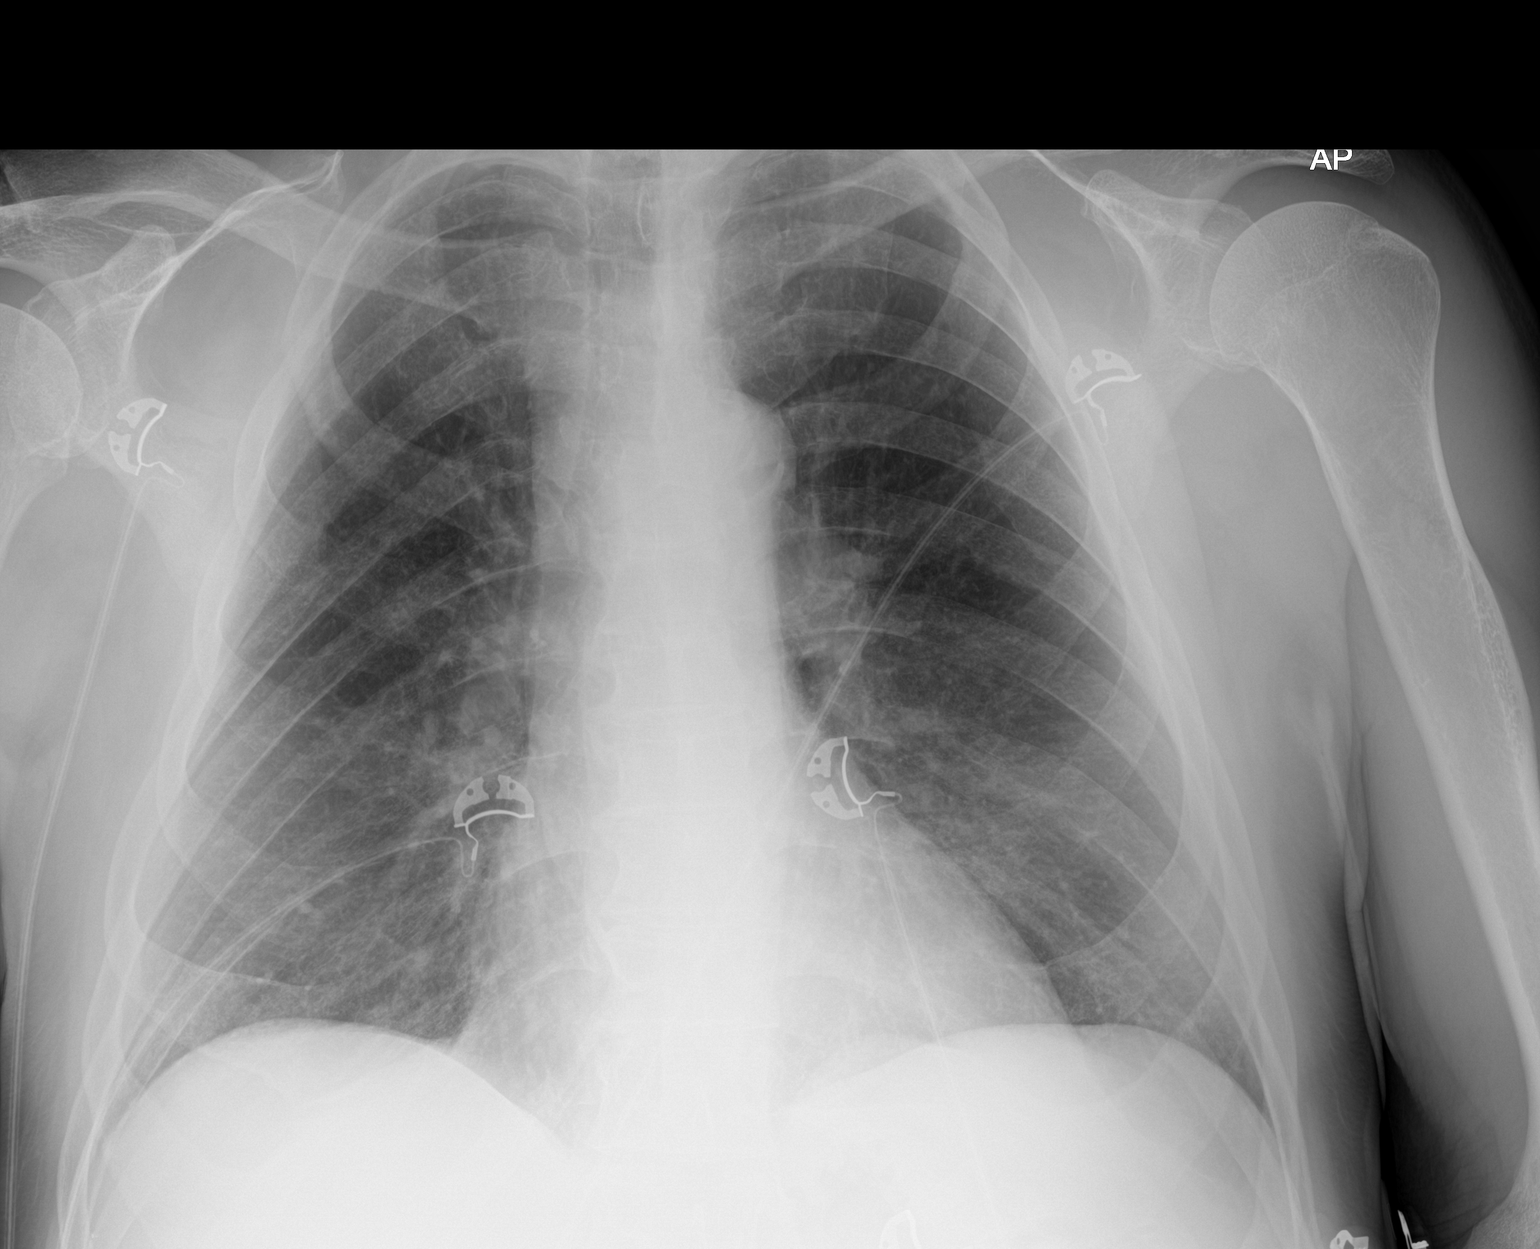

[1 of 1 positions shown; findings below may reference images not displayed]

FINDINGS: Cardiac silhouette is normal in size. No mediastinal or hilar masses
or evidence of adenopathy.

Lungs are clear.  No pleural effusion or pneumothorax.

Skeletal structures are grossly intact.
IMPRESSION: No active disease.

## 2021-08-31 IMAGING — CT CT ANGIO CHEST
2 of 9 series · 18 of 36 positions shown · IV contrast (omnipaque)
Comparison: None

CLINICAL DATA: Chest pain, abdominal pain, some difficulty
breathing, hurts debris, hypertension, diabetes mellitus, diagnosed
with BPJRL-Q4 three days ago

EXAM:
CT ANGIOGRAPHY CHEST WITH CONTRAST
TECHNIQUE: Multidetector CT imaging of the chest was performed using the
standard protocol during bolus administration of intravenous
contrast. Multiplanar CT image reconstructions and MIPs were
obtained to evaluate the vascular anatomy.
CONTRAST:  100mL OMNIPAQUE IOHEXOL 350 MG/ML SOLN IV

[Series 7: pe thins · axial · 0.78mm/px · z∈[-442,-104]mm · 17 of 382 slices shown]
[im 22/382  lung]
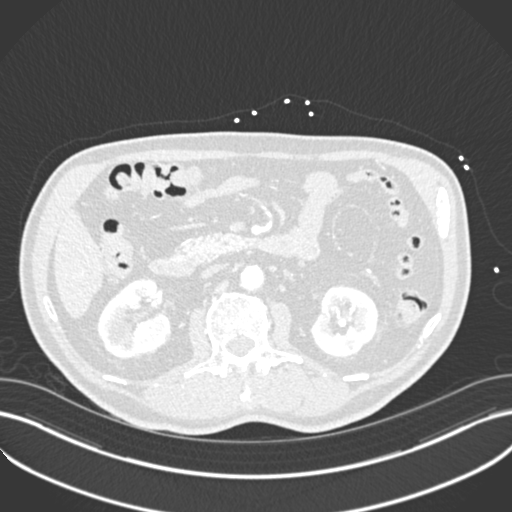
[im 43/382  mediastinal]
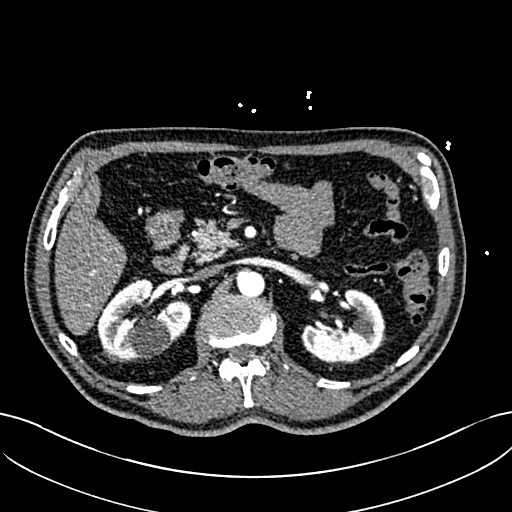
[im 64/382  lung]
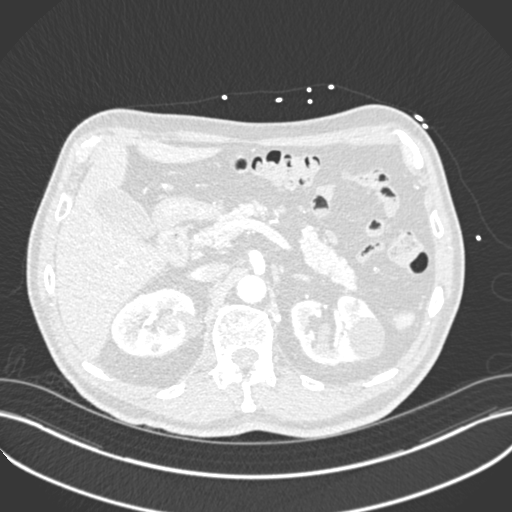
[im 85/382  mediastinal]
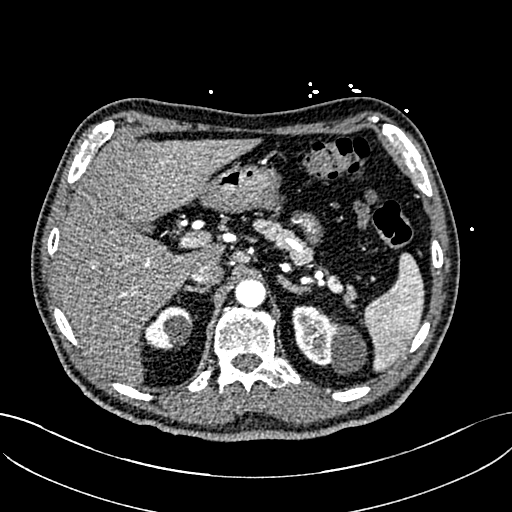
[im 106/382  lung]
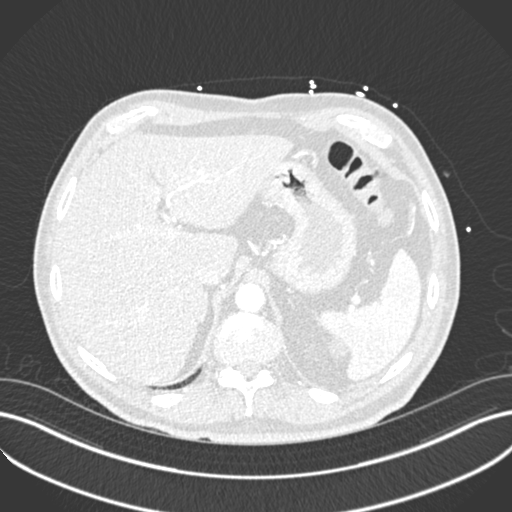
[im 128/382  mediastinal]
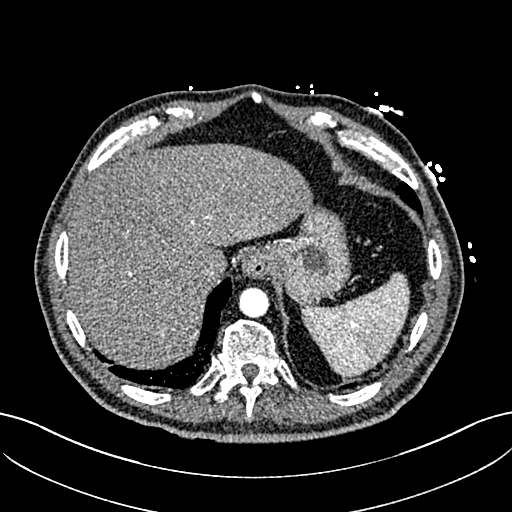
[im 149/382  lung]
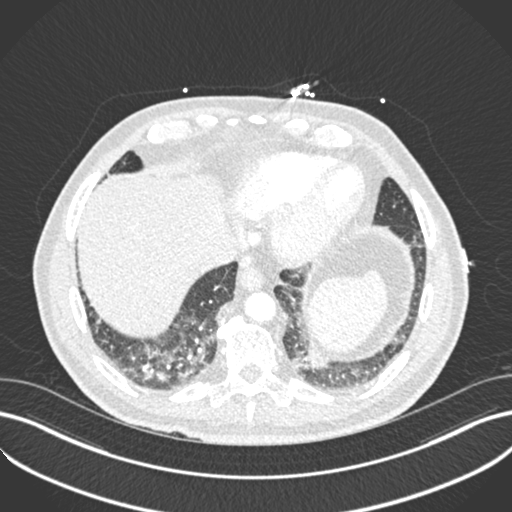
[im 170/382  mediastinal]
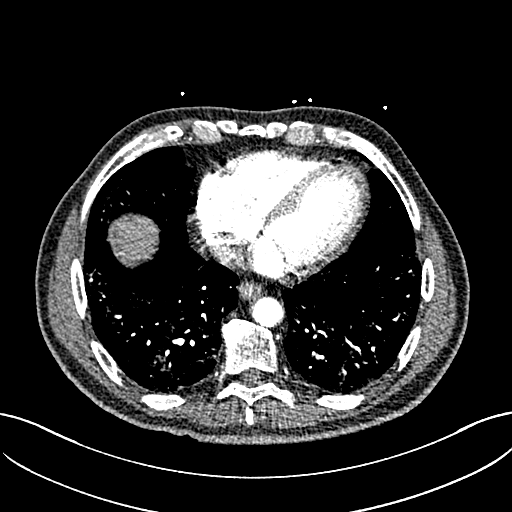
[im 191/382  lung]
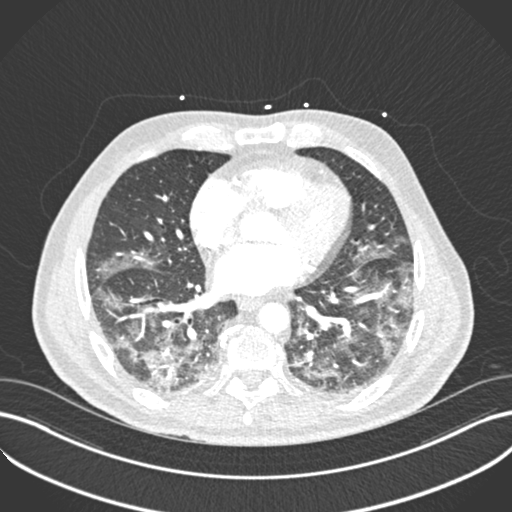
[im 212/382  mediastinal]
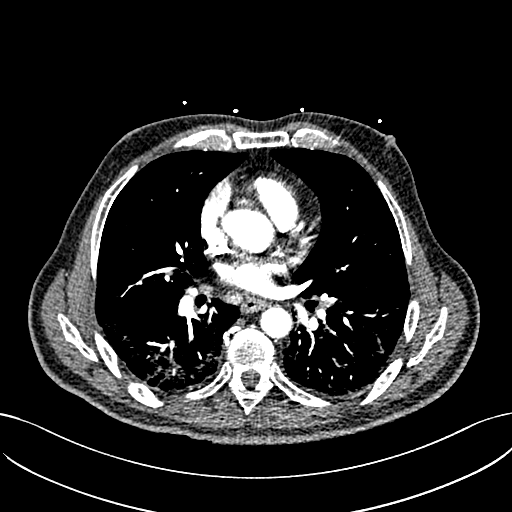
[im 233/382  lung]
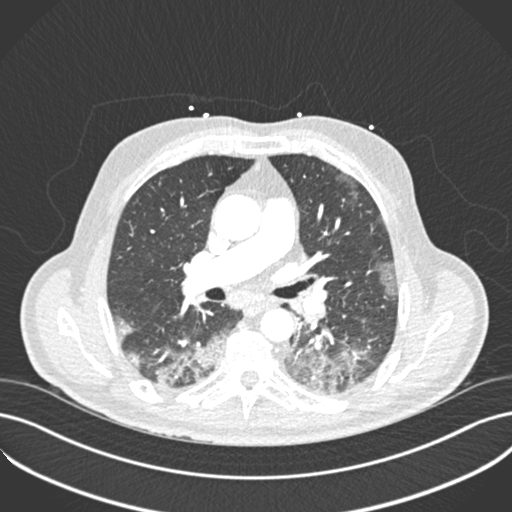
[im 255/382  mediastinal]
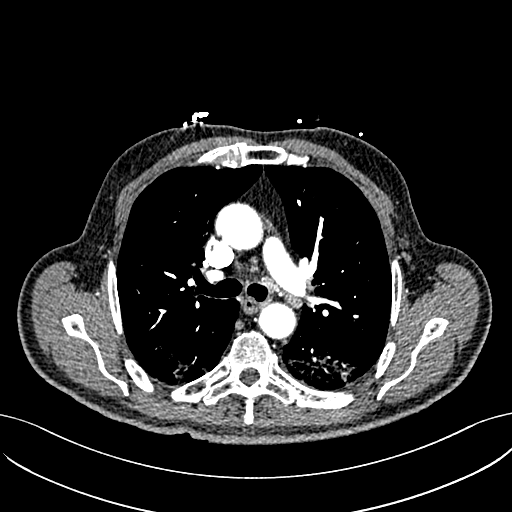
[im 276/382  lung]
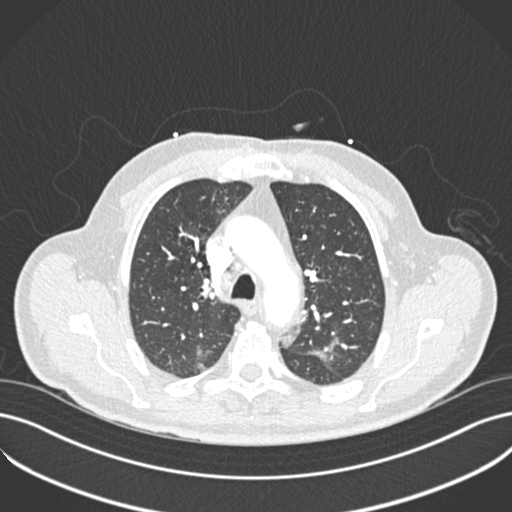
[im 297/382  mediastinal]
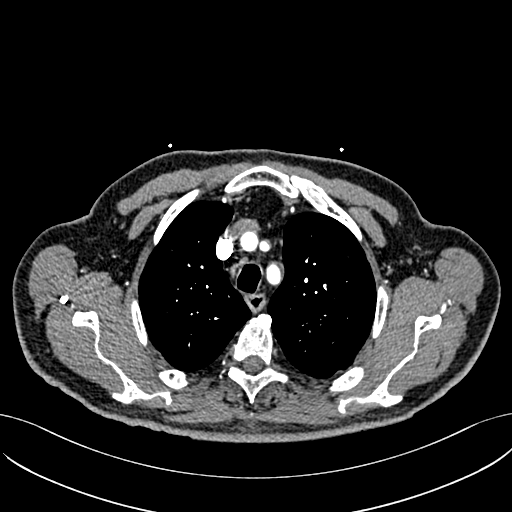
[im 318/382  lung]
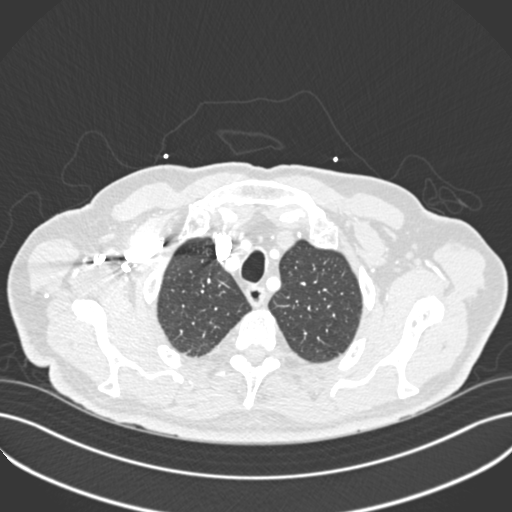
[im 339/382  mediastinal]
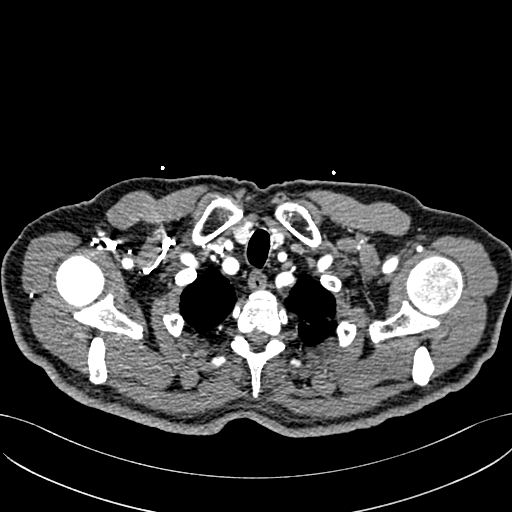
[im 360/382  lung]
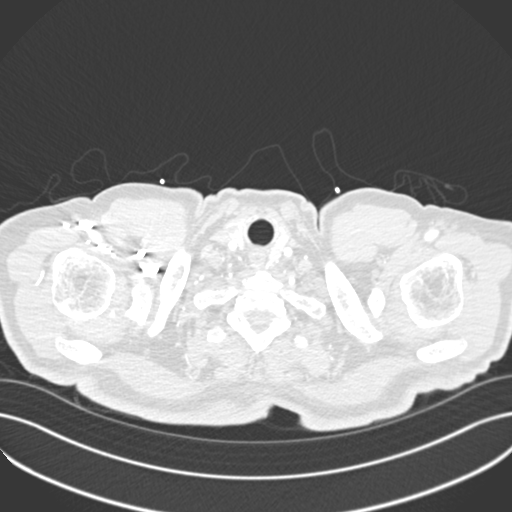

[Series 8: pe coronal mpr · coronal · 0.75mm/px · 1 of 139 slices shown]
[im 70/139  mediastinal]
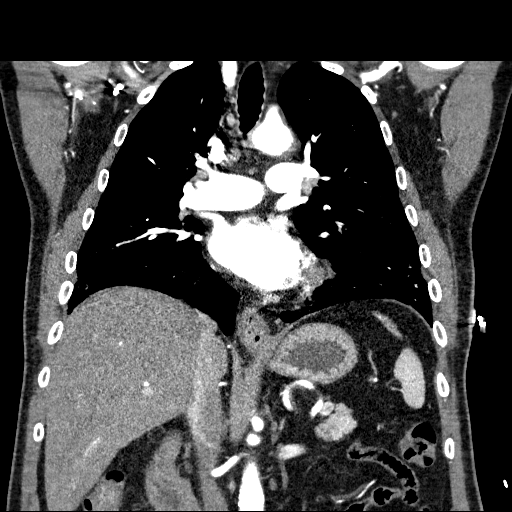

[18 of 36 positions shown; findings below may reference images not displayed]

FINDINGS: Cardiovascular: Atherosclerotic calcifications aorta and minimally
in coronary arteries. Aorta normal caliber without aneurysm or
dissection. Heart unremarkable. No pericardial effusion. Pulmonary
arteries adequately opacified and patent. No evidence of pulmonary
embolism.

Mediastinum/Nodes: 8 mm RIGHT thyroid nodule image 7; Not clinically
significant; no follow-up imaging recommended (ref: [HOSPITAL]. [DATE]): 143-50).Esophagus unremarkable. Scattered
normal size mediastinal lymph nodes. Enlarged subcarinal node 15 mm
image 52. No additional thoracic adenopathy.

Lungs/Pleura: Patchy airspace infiltrates greatest in lower lobes,
less in lingula and RIGHT middle lobe, consistent with COVID
pneumonia. Subpleural nodule RIGHT upper lobe 4 mm diameter image
37. 3 mm RIGHT upper lobe nodule image 29. No pleural effusion or
pneumothorax.

Upper Abdomen: Multiple BILATERAL renal cysts. Remaining visualized
upper abdomen unremarkable.

Musculoskeletal: No acute osseous findings.

Review of the MIP images confirms the above findings.
IMPRESSION: No evidence of pulmonary embolism.

Patchy BILATERAL airspace infiltrates consistent with multifocal
pneumonia / BPJRL-Q4.

Nonspecific enlarged subcarinal lymph node.

Aortic Atherosclerosis ([id number]-905.5).

## 2021-08-31 IMAGING — DX DG CHEST 1V PORT
1 series · 1 of 1 positions shown · non-contrast
Comparison: Chest radiograph 07/12/2019.

CLINICAL DATA: COVID. Additional history provided: Diagnosed with
COVID [REDACTED], abdominal pain with some difficulty breathing,
pain with breathing, history of diabetes and hypertension.

EXAM:
PORTABLE CHEST 1 VIEW

[chest ap]
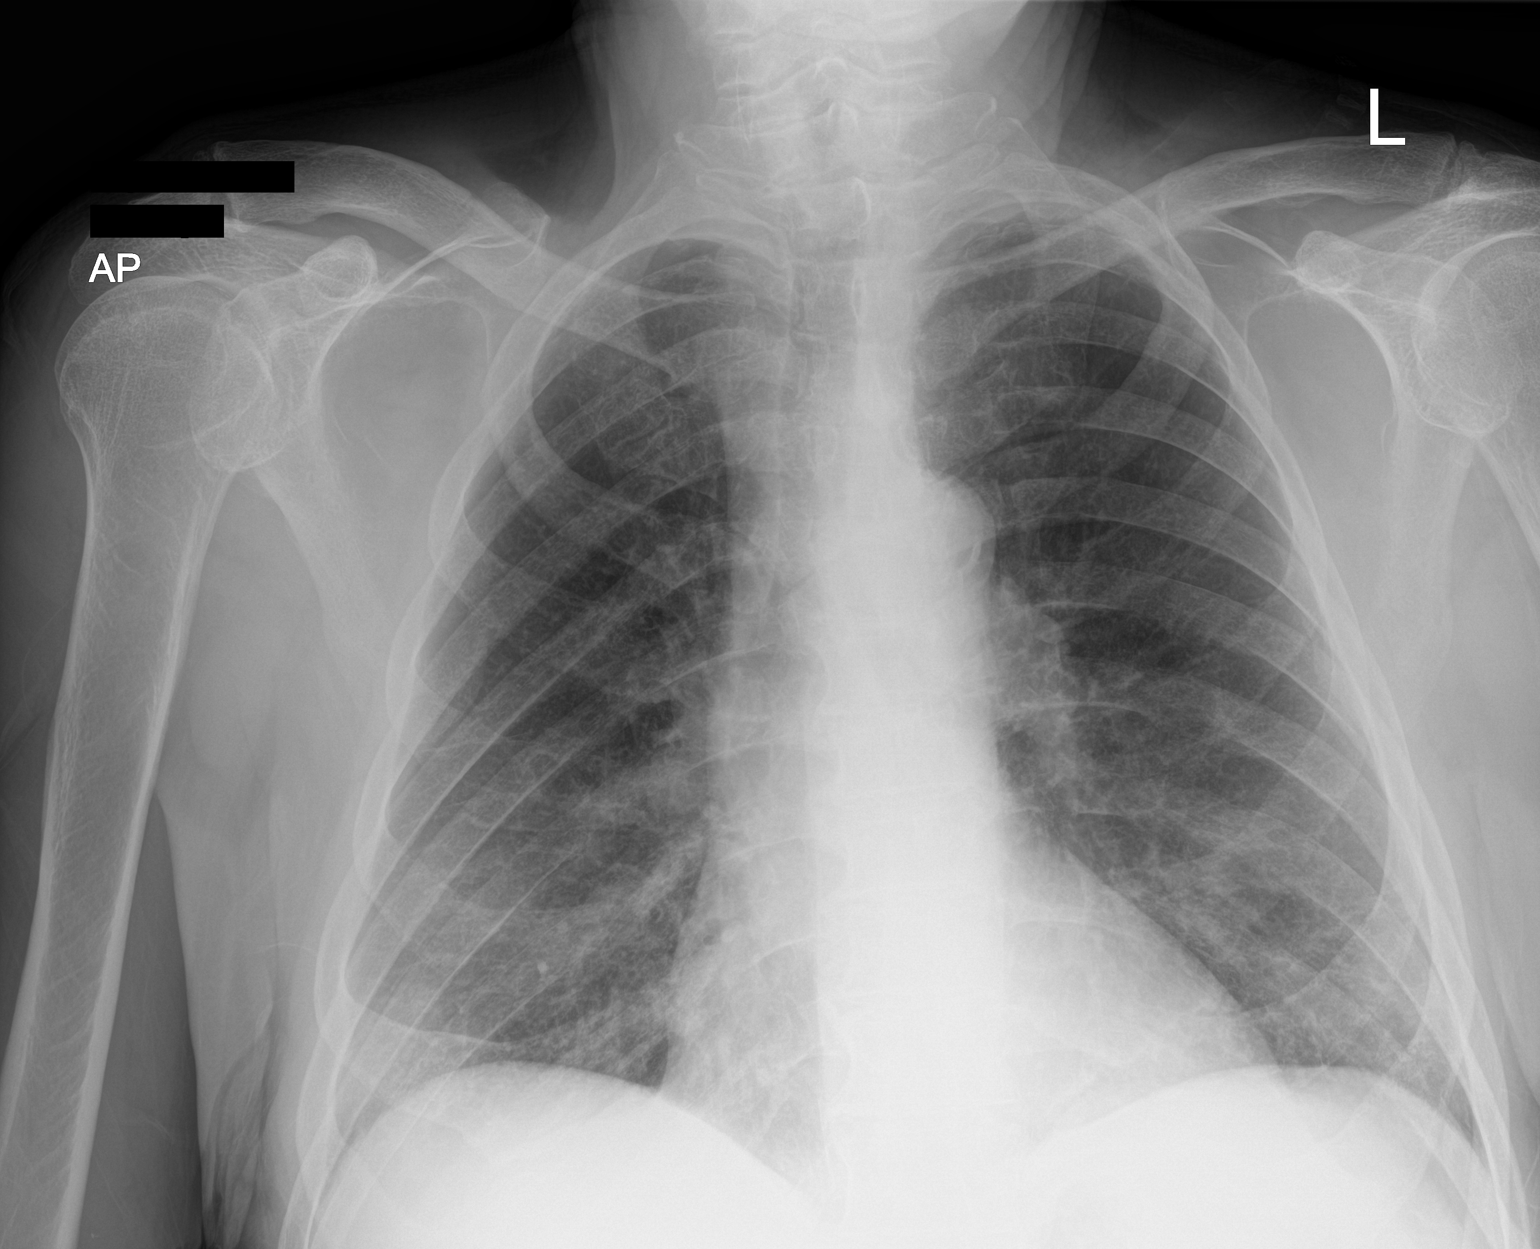

[1 of 1 positions shown; findings below may reference images not displayed]

FINDINGS: There are ill-defined opacities within the bilateral lung bases
which are new as compared to prior examination 07/12/2019 and
consistent with the provided history of COVID pneumonia. No evidence
of pleural effusion or pneumothorax. Heart size within normal
limits. Aortic atherosclerosis. No acute bony abnormality identified
IMPRESSION: Hazy opacities at the bilateral lung bases consistent with the
provided history of COVID pneumonia.

Aortic Atherosclerosis ([id number].M).
